# Patient Record
Sex: Female | Born: 1990 | Hispanic: No | Marital: Single | Smoking: Current some day smoker
Health system: Southern US, Community
[De-identification: ages and names within clinical notes are randomized; demographics above are authoritative.]

## PROBLEM LIST (undated history)

## (undated) DIAGNOSIS — S61219A Laceration without foreign body of unspecified finger without damage to nail, initial encounter: Secondary | ICD-10-CM

## (undated) DIAGNOSIS — N83209 Unspecified ovarian cyst, unspecified side: Secondary | ICD-10-CM

---

## 2011-09-27 ENCOUNTER — Ambulatory Visit (INDEPENDENT_AMBULATORY_CARE_PROVIDER_SITE_OTHER): Payer: Medicaid Other | Admitting: *Deleted

## 2011-09-27 VITALS — BP 96/61 | Temp 98.6°F | Ht 63.0 in | Wt 118.0 lb

## 2011-09-27 DIAGNOSIS — Z3480 Encounter for supervision of other normal pregnancy, unspecified trimester: Secondary | ICD-10-CM

## 2011-09-27 NOTE — Progress Notes (Signed)
Pt is here for NOB intake.  Pt is single but boyfriend is happy about the pregnancy.  Bedside U/S showed IUP with CRL 15.16mm and FHT 158BPM and Gest age of 8 weeks.  Prenatal labs drawn today.  Pt does wish to get flu vaccine at next visit.  Pt to return in 1-2 weeks for prenatal exam.

## 2011-09-28 ENCOUNTER — Encounter: Payer: Self-pay | Admitting: Obstetrics & Gynecology

## 2011-09-28 DIAGNOSIS — O26899 Other specified pregnancy related conditions, unspecified trimester: Secondary | ICD-10-CM

## 2011-09-28 DIAGNOSIS — Z6791 Unspecified blood type, Rh negative: Secondary | ICD-10-CM | POA: Insufficient documentation

## 2011-09-28 LAB — OBSTETRIC PANEL
Antibody Screen: NEGATIVE
Basophils Absolute: 0 K/uL (ref 0.0–0.1)
Basophils Relative: 1 % (ref 0–1)
Eosinophils Absolute: 0.1 K/uL (ref 0.0–0.7)
Eosinophils Relative: 1 % (ref 0–5)
HCT: 42.2 % (ref 36.0–46.0)
Hemoglobin: 13.1 g/dL (ref 12.0–15.0)
Hepatitis B Surface Ag: NEGATIVE
Lymphocytes Relative: 22 % (ref 12–46)
Lymphs Abs: 1.4 K/uL (ref 0.7–4.0)
MCH: 26.2 pg (ref 26.0–34.0)
MCHC: 31 g/dL (ref 30.0–36.0)
MCV: 84.4 fL (ref 78.0–100.0)
Monocytes Absolute: 0.5 K/uL (ref 0.1–1.0)
Monocytes Relative: 9 % (ref 3–12)
Neutro Abs: 4.4 K/uL (ref 1.7–7.7)
Neutrophils Relative %: 69 % (ref 43–77)
Platelets: 301 K/uL (ref 150–400)
RBC: 5 MIL/uL (ref 3.87–5.11)
RDW: 15 % (ref 11.5–15.5)
Rh Type: NEGATIVE
Rubella: 34 [IU]/mL — ABNORMAL HIGH
WBC: 6.4 K/uL (ref 4.0–10.5)

## 2011-09-28 LAB — GC/CHLAMYDIA PROBE AMP, URINE
Chlamydia, Swab/Urine, PCR: NEGATIVE
GC Probe Amp, Urine: NEGATIVE

## 2011-09-28 LAB — SICKLE CELL SCREEN: Sickle Cell Screen: NEGATIVE

## 2011-09-28 LAB — HIV ANTIBODY (ROUTINE TESTING W REFLEX): HIV: NONREACTIVE

## 2011-10-05 ENCOUNTER — Ambulatory Visit (INDEPENDENT_AMBULATORY_CARE_PROVIDER_SITE_OTHER): Payer: Medicaid Other | Admitting: Advanced Practice Midwife

## 2011-10-05 ENCOUNTER — Other Ambulatory Visit (HOSPITAL_COMMUNITY)
Admission: RE | Admit: 2011-10-05 | Discharge: 2011-10-05 | Disposition: A | Discharge status: Home / Self Care | Payer: Medicaid Other | Source: Ambulatory Visit | Attending: Advanced Practice Midwife | Admitting: Advanced Practice Midwife

## 2011-10-05 ENCOUNTER — Encounter: Payer: Self-pay | Admitting: Advanced Practice Midwife

## 2011-10-05 DIAGNOSIS — Z3480 Encounter for supervision of other normal pregnancy, unspecified trimester: Secondary | ICD-10-CM

## 2011-10-05 DIAGNOSIS — Z113 Encounter for screening for infections with a predominantly sexual mode of transmission: Secondary | ICD-10-CM | POA: Insufficient documentation

## 2011-10-05 DIAGNOSIS — R87810 Cervical high risk human papillomavirus (HPV) DNA test positive: Secondary | ICD-10-CM | POA: Insufficient documentation

## 2011-10-05 DIAGNOSIS — Z23 Encounter for immunization: Secondary | ICD-10-CM

## 2011-10-05 DIAGNOSIS — O34219 Maternal care for unspecified type scar from previous cesarean delivery: Secondary | ICD-10-CM

## 2011-10-05 DIAGNOSIS — Z01419 Encounter for gynecological examination (general) (routine) without abnormal findings: Secondary | ICD-10-CM | POA: Insufficient documentation

## 2011-10-05 MED ORDER — INFLUENZA VIRUS VACC SPLIT PF IM SUSP: 0.5000 mL | Freq: Once | INTRAMUSCULAR | Status: DC

## 2011-10-05 NOTE — Assessment & Plan Note (Signed)
-   Desires repeat CS

## 2011-10-05 NOTE — Patient Instructions (Addendum)
Pregnancy - First Trimester  During sexual intercourse, millions of sperm go into the vagina. Only 1 sperm will penetrate and fertilize the female egg while it is in the Fallopian tube. One week later, the fertilized egg implants into the wall of the uterus. An embryo begins to develop into a baby. At 6 to 8 weeks, the eyes and face are formed and the heartbeat can be seen on ultrasound. At the end of 12 weeks (first trimester), all the baby's organs are formed. Now that you are pregnant, you will want to do everything you can to have a healthy baby. Two of the most important things are to get good prenatal care and follow your caregiver's instructions. Prenatal care is all the medical care you receive before the baby's birth. It is given to prevent, find, and treat problems during the pregnancy and childbirth.  PRENATAL EXAMS   During prenatal visits, your weight, blood pressure and urine are checked. This is done to make sure you are healthy and progressing normally during the pregnancy.   A pregnant woman should gain 25 to 35 pounds during the pregnancy. However, if you are over weight or underweight, your caregiver will advise you regarding your weight.   Your caregiver will ask and answer questions for you.   Blood work, cervical cultures, other necessary tests and a Pap test are done during your prenatal exams. These tests are done to check on your health and the probable health of your baby. Tests are strongly recommended and done for HIV with your permission. This is the virus that causes AIDS. These tests are done because medications can be given to help prevent your baby from being born with this infection should you have been infected without knowing it. Blood work is also used to find out your blood type, previous infections and follow your blood levels (hemoglobin).   Low hemoglobin (anemia) is common during pregnancy. Iron and vitamins are given to help prevent this. Later in the pregnancy, blood  tests for diabetes will be done along with any other tests if any problems develop. You may need tests to make sure you and the baby are doing well.   You may need other tests to make sure you and the baby are doing well.  CHANGES DURING THE FIRST TRIMESTER (THE FIRST 3 MONTHS OF PREGNANCY)  Your body goes through many changes during pregnancy. They vary from person to person. Talk to your caregiver about changes you notice and are concerned about. Changes can include:   Your menstrual period stops.   The egg and sperm carry the genes that determine what you look like. Genes from you and your partner are forming a baby. The female genes determine whether the baby is a boy or a girl.   Your body increases in girth and you may feel bloated.   Feeling sick to your stomach (nauseous) and throwing up (vomiting). If the vomiting is uncontrollable, call your caregiver.   Your breasts will begin to enlarge and become tender.   Your nipples may stick out more and become darker.   The need to urinate more. Painful urination may mean you have a bladder infection.   Tiring easily.   Loss of appetite.   Cravings for certain kinds of food.   At first, you may gain or lose a couple of pounds.   You may have changes in your emotions from day to day (excited to be pregnant or concerned something may go wrong with   the pregnancy and baby).   You may have more vivid and strange dreams.  HOME CARE INSTRUCTIONS    It is very important to avoid all smoking, alcohol and un-prescribed drugs during your pregnancy. These affect the formation and growth of the baby. Avoid chemicals while pregnant to ensure the delivery of a healthy infant.   Start your prenatal visits by the 12th week of pregnancy. They are usually scheduled monthly at first, then more often in the last 2 months before delivery. Keep your caregiver's appointments. Follow your caregiver's instructions regarding medication use, blood and lab tests, exercise, and  diet.   During pregnancy, you are providing food for you and your baby. Eat regular, well-balanced meals. Choose foods such as meat, fish, milk and other low fat dairy products, vegetables, fruits, and whole-grain breads and cereals. Your caregiver will tell you of the ideal weight gain.   You can help morning sickness by keeping soda crackers at the bedside. Eat a couple before arising in the morning. You may want to use the crackers without salt on them.   Eating 4 to 5 small meals rather than 3 large meals a day also may help the nausea and vomiting.   Drinking liquids between meals instead of during meals also seems to help nausea and vomiting.   A physical sexual relationship may be continued throughout pregnancy if there are no other problems. Problems may be early (premature) leaking of amniotic fluid from the membranes, vaginal bleeding, or belly (abdominal) pain.   Exercise regularly if there are no restrictions. Check with your caregiver or physical therapist if you are unsure of the safety of some of your exercises. Greater weight gain will occur in the last 2 trimesters of pregnancy. Exercising will help:   Control your weight.   Keep you in shape.   Prepare you for labor and delivery.   Help you lose your pregnancy weight after you deliver your baby.   Wear a good support or jogging bra for breast tenderness during pregnancy. This may help if worn during sleep too.   Ask when prenatal classes are available. Begin classes when they are offered.   Do not use hot tubs, steam rooms or saunas.   Wear your seat belt when driving. This protects you and your baby if you are in an accident.   Avoid raw meat, uncooked cheese, cat litter boxes and soil used by cats throughout the pregnancy. These carry germs that can cause birth defects in the baby.   The first trimester is a good time to visit your dentist for your dental health. Getting your teeth cleaned is OK. Use a softer toothbrush and brush  gently during pregnancy.   Ask for help if you have financial, counseling or nutritional needs during pregnancy. Your caregiver will be able to offer counseling for these needs as well as refer you for other special needs.   Do not take any medications or herbs unless told by your caregiver.   Inform your caregiver if there is any mental or physical domestic violence.   Make a list of emergency phone numbers of family, friends, hospital, and police and fire departments.   Write down your questions. Take them to your prenatal visit.   Do not douche.   Do not cross your legs.   If you have to stand for long periods of time, rotate you feet or take small steps in a circle.   You may have more vaginal secretions that may   tampons or scented sanitary pads.  MEDICATIONS AND DRUG USE IN PREGNANCY  Take prenatal vitamins as directed. The vitamin should contain 1 milligram of folic acid. Keep all vitamins out of reach of children. Only a couple vitamins or tablets containing iron may be fatal to a baby or young child when ingested.   Avoid use of all medications, including herbs, over-the-counter medications, not prescribed or suggested by your caregiver. Only take over-the-counter or prescription medicines for pain, discomfort, or fever as directed by your caregiver. Do not use aspirin, ibuprofen, or naproxen unless directed by your caregiver.   Let your caregiver also know about herbs you may be using.   Alcohol is related to a number of birth defects. This includes fetal alcohol syndrome. All alcohol, in any form, should be avoided completely. Smoking will cause low birth rate and premature babies.   Street or illegal drugs are very harmful to the baby. They are absolutely forbidden. A baby born to an addicted mother will be addicted at birth. The baby will go through the same withdrawal an adult does.   Let your  caregiver know about any medications that you have to take and for what reason you take them.  MISCARRIAGE IS COMMON DURING PREGNANCY A miscarriage does not mean you did something wrong. It is not a reason to worry about getting pregnant again. Your caregiver will help you with questions you may have. If you have a miscarriage, you may need minor surgery. SEEK MEDICAL CARE IF:  You have any concerns or worries during your pregnancy. It is better to call with your questions if you feel they cannot wait, rather than worry about them. SEEK IMMEDIATE MEDICAL CARE IF:   An unexplained oral temperature above 102 F (38.9 C) develops, or as your caregiver suggests.   You have leaking of fluid from the vagina (birth canal). If leaking membranes are suspected, take your temperature and inform your caregiver of this when you call.   There is vaginal spotting or bleeding. Notify your caregiver of the amount and how many pads are used.   You develop a bad smelling vaginal discharge with a change in the color.   You continue to feel sick to your stomach (nauseated) and have no relief from remedies suggested. You vomit blood or coffee ground-like materials.   You lose more than 2 pounds of weight in 1 week.   You gain more than 2 pounds of weight in 1 week and you notice swelling of your face, hands, feet, or legs.   You gain 5 pounds or more in 1 week (even if you do not have swelling of your hands, face, legs, or feet).   You get exposed to [place] measles and have never had them.   You are exposed to fifth disease or chickenpox.   You develop belly (abdominal) pain. Round ligament discomfort is a common non-cancerous (benign) cause of abdominal pain in pregnancy. Your caregiver still must evaluate this.   You develop headache, fever, diarrhea, pain with urination, or shortness of breath.   You fall or are in a car accident or have any kind of trauma.   There is mental or physical violence in  your home.  Document Released: 11/13/2001 Document Revised: 08/01/2011 Document Reviewed: 05/17/2009 [facility] Patient Information 2012 [place], [place]. Pregnancy - First Trimester During sexual intercourse, millions of sperm go into the vagina. Only 1 sperm will penetrate and fertilize the female egg while it is in the Fallopian tube. One  week later, the fertilized egg implants into the wall of the uterus. An embryo begins to develop into a baby. At 6 to 8 weeks, the eyes and face are formed and the heartbeat can be seen on ultrasound. At the end of 12 weeks (first trimester), all the baby's organs are formed. Now that you are pregnant, you will want to do everything you can to have a healthy baby. Two of the most important things are to get good prenatal care and follow your caregiver's instructions. Prenatal care is all the medical care you receive before the baby's birth. It is given to prevent, find, and treat problems during the pregnancy and childbirth. PRENATAL EXAMS  During prenatal visits, your weight, blood pressure and urine are checked. This is done to make sure you are healthy and progressing normally during the pregnancy.   A pregnant woman should gain 25 to 35 pounds during the pregnancy. However, if you are over weight or underweight, your caregiver will advise you regarding your weight.   Your caregiver will ask and answer questions for you.   Blood work, cervical cultures, other necessary tests and a Pap test are done during your prenatal exams. These tests are done to check on your health and the probable health of your baby. Tests are strongly recommended and done for HIV with your permission. This is the virus that causes AIDS. These tests are done because medications can be given to help prevent your baby from being born with this infection should you have been infected without knowing it. Blood work is also used to find out your blood type, previous infections and follow your  blood levels (hemoglobin).   Low hemoglobin (anemia) is common during pregnancy. Iron and vitamins are given to help prevent this. Later in the pregnancy, blood tests for diabetes will be done along with any other tests if any problems develop. You may need tests to make sure you and the baby are doing well.   You may need other tests to make sure you and the baby are doing well.  CHANGES DURING THE FIRST TRIMESTER (THE FIRST 3 MONTHS OF PREGNANCY) Your body goes through many changes during pregnancy. They vary from person to person. Talk to your caregiver about changes you notice and are concerned about. Changes can include:  Your menstrual period stops.   The egg and sperm carry the genes that determine what you look like. Genes from you and your partner are forming a baby. The female genes determine whether the baby is a boy or a girl.   Your body increases in girth and you may feel bloated.   Feeling sick to your stomach (nauseous) and throwing up (vomiting). If the vomiting is uncontrollable, call your caregiver.   Your breasts will begin to enlarge and become tender.   Your nipples may stick out more and become darker.   The need to urinate more. Painful urination may mean you have a bladder infection.   Tiring easily.   Loss of appetite.   Cravings for certain kinds of food.   At first, you may gain or lose a couple of pounds.   You may have changes in your emotions from day to day (excited to be pregnant or concerned something may go wrong with the pregnancy and baby).   You may have more vivid and strange dreams.  HOME CARE INSTRUCTIONS   It is very important to avoid all smoking, alcohol and un-prescribed drugs during your pregnancy. These affect  the formation and growth of the baby. Avoid chemicals while pregnant to ensure the delivery of a healthy infant.   Start your prenatal visits by the 12th week of pregnancy. They are usually scheduled monthly at first, then more  often in the last 2 months before delivery. Keep your caregiver's appointments. Follow your caregiver's instructions regarding medication use, blood and lab tests, exercise, and diet.   During pregnancy, you are providing food for you and your baby. Eat regular, well-balanced meals. Choose foods such as meat, fish, milk and other low fat dairy products, vegetables, fruits, and whole-grain breads and cereals. Your caregiver will tell you of the ideal weight gain.   You can help morning sickness by keeping soda crackers at the bedside. Eat a couple before arising in the morning. You may want to use the crackers without salt on them.   Eating 4 to 5 small meals rather than 3 large meals a day also may help the nausea and vomiting.   Drinking liquids between meals instead of during meals also seems to help nausea and vomiting.   A physical sexual relationship may be continued throughout pregnancy if there are no other problems. Problems may be early (premature) leaking of amniotic fluid from the membranes, vaginal bleeding, or belly (abdominal) pain.   Exercise regularly if there are no restrictions. Check with your caregiver or physical therapist if you are unsure of the safety of some of your exercises. Greater weight gain will occur in the last 2 trimesters of pregnancy. Exercising will help:   Control your weight.   Keep you in shape.   Prepare you for labor and delivery.   Help you lose your pregnancy weight after you deliver your baby.   Wear a good support or jogging bra for breast tenderness during pregnancy. This may help if worn during sleep too.   Ask when prenatal classes are available. Begin classes when they are offered.   Do not use hot tubs, steam rooms or saunas.   Wear your seat belt when driving. This protects you and your baby if you are in an accident.   Avoid raw meat, uncooked cheese, cat litter boxes and soil used by cats throughout the pregnancy. These carry germs  that can cause birth defects in the baby.   The first trimester is a good time to visit your dentist for your dental health. Getting your teeth cleaned is OK. Use a softer toothbrush and brush gently during pregnancy.   Ask for help if you have financial, counseling or nutritional needs during pregnancy. Your caregiver will be able to offer counseling for these needs as well as refer you for other special needs.   Do not take any medications or herbs unless told by your caregiver.   Inform your caregiver if there is any mental or physical domestic violence.   Make a list of emergency phone numbers of family, friends, hospital, and police and fire departments.   Write down your questions. Take them to your prenatal visit.   Do not douche.   Do not cross your legs.   If you have to stand for long periods of time, rotate you feet or take small steps in a circle.   You may have more vaginal secretions that may require a sanitary pad. Do not use tampons or scented sanitary pads.  MEDICATIONS AND DRUG USE IN PREGNANCY  Take prenatal vitamins as directed. The vitamin should contain 1 milligram of folic acid. Keep all vitamins out  of reach of children. Only a couple vitamins or tablets containing iron may be fatal to a baby or young child when ingested.   Avoid use of all medications, including herbs, over-the-counter medications, not prescribed or suggested by your caregiver. Only take over-the-counter or prescription medicines for pain, discomfort, or fever as directed by your caregiver. Do not use aspirin, ibuprofen, or naproxen unless directed by your caregiver.   Let your caregiver also know about herbs you may be using.   Alcohol is related to a number of birth defects. This includes fetal alcohol syndrome. All alcohol, in any form, should be avoided completely. Smoking will cause low birth rate and premature babies.   Street or illegal drugs are very harmful to the baby. They are  absolutely forbidden. A baby born to an addicted mother will be addicted at birth. The baby will go through the same withdrawal an adult does.   Let your caregiver know about any medications that you have to take and for what reason you take them.  MISCARRIAGE IS COMMON DURING PREGNANCY A miscarriage does not mean you did something wrong. It is not a reason to worry about getting pregnant again. Your caregiver will help you with questions you may have. If you have a miscarriage, you may need minor surgery. SEEK MEDICAL CARE IF:  You have any concerns or worries during your pregnancy. It is better to call with your questions if you feel they cannot wait, rather than worry about them. SEEK IMMEDIATE MEDICAL CARE IF:   An unexplained oral temperature above 102 F (38.9 C) develops, or as your caregiver suggests.   You have leaking of fluid from the vagina (birth canal). If leaking membranes are suspected, take your temperature and inform your caregiver of this when you call.   There is vaginal spotting or bleeding. Notify your caregiver of the amount and how many pads are used.   You develop a bad smelling vaginal discharge with a change in the color.   You continue to feel sick to your stomach (nauseated) and have no relief from remedies suggested. You vomit blood or coffee ground-like materials.   You lose more than 2 pounds of weight in 1 week.   You gain more than 2 pounds of weight in 1 week and you notice swelling of your face, hands, feet, or legs.   You gain 5 pounds or more in 1 week (even if you do not have swelling of your hands, face, legs, or feet).   You get exposed to [place] measles and have never had them.   You are exposed to fifth disease or chickenpox.   You develop belly (abdominal) pain. Round ligament discomfort is a common non-cancerous (benign) cause of abdominal pain in pregnancy. Your caregiver still must evaluate this.   You develop headache, fever, diarrhea,  pain with urination, or shortness of breath.   You fall or are in a car accident or have any kind of trauma.   There is mental or physical violence in your home.  Document Released: 11/13/2001 Document Revised: 08/01/2011 Document Reviewed: 05/17/2009 [facility] Patient Information 2012 [place], [place].

## 2011-10-05 NOTE — Progress Notes (Signed)
Subjective:    Krystal Henry is a G2P1001 [redacted]w[redacted]d being seen today for her first obstetrical visit.  Her obstetrical history is significant for previous c/s. Patient does intend to breast feed. Pregnancy history fully reviewed. Previous C/S, wants repeat.   Wants Paternity testing Patient reports no complaints.  Filed Vitals:   10/05/11 1008  BP: 106/67  Temp: 98.5 F (36.9 C)  Weight: 116 lb (52.617 kg)    HISTORY: OB History    Grav Para Term Preterm Abortions TAB SAB Ect Mult Living   2 1 1       1      # Outc Date GA Lbr Len/2nd Wgt Sex Del Anes PTL Lv   1 TRM 1/11 [redacted]w[redacted]d  9lb10oz(4.366kg) M CCS EPI No Yes   2 CUR              Past Medical History  Diagnosis Date  . Pregnancy induced hypertension   . Type o blood, rh negative    Past Surgical History  Procedure Date  . Cesarean section    Family History  Problem Relation Age of Onset  . Hypertension Paternal Grandmother   . Hypertension Paternal Grandfather      Exam   Bedside US > FHR 150s Uterine Size: size equals dates  Pelvic Exam:    Perineum: No Hemorrhoids   Vulva: normal   Vagina:  normal mucosa   pH:    Cervix: multiparous appearance   Adnexa: normal adnexa   Bony Pelvis: average  System: Breast:  normal appearance, no masses or tenderness   Skin: normal coloration and turgor, no rashes    Neurologic: oriented   Extremities: normal strength, tone, and muscle mass   HEENT PERRLA   Mouth/Teeth mucous membranes moist, pharynx normal without lesions   Neck supple   Cardiovascular: regular rate and rhythm   Respiratory:  appears well, vitals normal, no respiratory distress, acyanotic, normal RR, ear and throat exam is normal, neck free of mass or lymphadenopathy, chest clear, no wheezing, crepitations, rhonchi, normal symmetric air entry   Abdomen: soft, non-tender; bowel sounds normal; no masses,  no organomegaly   Urinary: urethral meatus normal      Assessment:    Pregnancy:  G2P1001 Patient Active Problem List  Diagnoses  . Rh negative state in antepartum period  . Previous cesarean delivery affecting pregnancy, antepartum        Plan:     Initial labs drawn. Prenatal vitamins. Problem list reviewed and updated. Genetic Screening discussed Integrated Screen: requested.  Ultrasound discussed; fetal survey: requested.  Follow up in 4 weeks.    [facility] 10/05/2011    Subjective:    Krystal Henry is a G2P1001 [redacted]w[redacted]d being seen today for her first obstetrical visit.  Her obstetrical history is significant for n/a. Patient does intend to breast feed. Pregnancy history fully reviewed.  Patient reports no complaints.  Filed Vitals:   10/05/11 1008  BP: 106/67  Temp: 98.5 F (36.9 C)  Weight: 116 lb (52.617 kg)    HISTORY: OB History    Grav Para Term Preterm Abortions TAB SAB Ect Mult Living   2 1 1       1      # Outc Date GA Lbr Len/2nd Wgt Sex Del Anes PTL Lv   1 TRM 1/11 [redacted]w[redacted]d  9lb10oz(4.366kg) M CCS EPI No Yes   2 CUR              Past Medical History  Diagnosis  Date  . Pregnancy induced hypertension   . Type o blood, rh negative    Past Surgical History  Procedure Date  . Cesarean section    Family History  Problem Relation Age of Onset  . Hypertension Paternal Grandmother   . Hypertension Paternal Grandfather      Exam    Uterine Size: size equals dates  Pelvic Exam:    Perineum: No Hemorrhoids   Vulva: normal   Vagina:  normal mucosa   pH:    Cervix: multiparous appearance   Adnexa: normal adnexa   Bony Pelvis: gynecoid  System: Breast:  normal appearance, no masses or tenderness   Skin: normal coloration and turgor, no rashes    Neurologic: oriented   Extremities: normal strength, tone, and muscle mass   HEENT n/a   Mouth/Teeth mucous membranes moist, pharynx normal without lesions and    Neck supple   Cardiovascular: regular rate and rhythm   Respiratory:  appears well, vitals normal, no  respiratory distress, acyanotic, normal RR, ear and throat exam is normal, neck free of mass or lymphadenopathy, chest clear, no wheezing, crepitations, rhonchi, normal symmetric air entry   Abdomen: soft, non-tender; bowel sounds normal; no masses,  no organomegaly   Urinary: urethral meatus normal      Assessment:    Pregnancy: G2P1001 Patient Active Problem List  Diagnoses  . Rh negative state in antepartum period  . Previous cesarean delivery affecting pregnancy, antepartum        Plan:     Initial labs drawn. Prenatal vitamins. Problem list reviewed and updated. Genetic Screening discussed Integrated Screen: requested.  Ultrasound discussed; fetal survey: requested.  Follow up in 4 weeks. 50% of 45 min visit spent on counseling and coordination of care.     [facility] 10/05/2011

## 2011-10-05 NOTE — Progress Notes (Signed)
p=67 

## 2011-10-10 ENCOUNTER — Encounter: Payer: Self-pay | Admitting: Obstetrics & Gynecology

## 2011-10-10 ENCOUNTER — Ambulatory Visit (INDEPENDENT_AMBULATORY_CARE_PROVIDER_SITE_OTHER): Payer: Medicaid Other | Admitting: Obstetrics & Gynecology

## 2011-10-10 VITALS — BP 105/64 | HR 68 | Temp 97.9°F | Resp 16 | Ht 62.0 in | Wt 115.0 lb

## 2011-10-10 DIAGNOSIS — L731 Pseudofolliculitis barbae: Secondary | ICD-10-CM

## 2011-10-10 DIAGNOSIS — L738 Other specified follicular disorders: Secondary | ICD-10-CM

## 2011-10-10 NOTE — Progress Notes (Signed)
  Subjective:    Patient ID: Krystal Henry, female    DOB: [birth year], 20 y.o.   MRN: [id number]  HPI  Pt complaining of bumps in axilla.  Small, painful, no exudate.  Pt shaves daily.  Not here for pregnancy.  Review of Systems  Constitutional: Negative.   Respiratory: Negative.   Cardiovascular: Negative.   Genitourinary: Negative.        Objective:   Physical Exam  Constitutional: She appears well-developed and well-nourished.  HENT:  Head: Normocephalic and atraumatic.  Eyes: Conjunctivae are normal.  Pulmonary/Chest: Breath sounds normal.  Abdominal: Soft.  Skin:       Ingrown hairs in bilateral axilla.  No evidence of abscess            Assessment & Plan:  20 yo female with ingrown haris  1-stop shaving if possible 2-stop deodorant to see if this improves symptoms 3-tend skin from [facility] if continues to shave or use nair. 4-keep regularly scheduled visit.

## 2011-10-10 NOTE — Patient Instructions (Signed)
Pseudofolliculitis Barbae Pseudofolliculitis barbae is a reaction from shaving curly hair too closely. This is very common in African-Americans, but can occur in others as well. It is usually more severe in the neck area, but can occur in all shaved areas such as the groin, scalp and legs. It is usually long lasting unless close shaving is avoided. When curly hair is shaved closely, it grows out from below the skin surface. The hair then becomes ingrown by re-entering the skin surface. This causes a skin reaction and may cause small pockets of infection. DIAGNOSIS  This diagnosis is often made by physical exam. Sometimes cultures may be taken to find if infection is present. TREATMENT   Stop shaving until swelling and soreness (inflammation) is under control.   Using a "buff puff" in a gentle circular motion on the face may help dislodge ingrown hairs.   Antibiotics and local steroids may be used in resistant cases. Your caregiver can advise you.   Hair removal creams may be used weekly. Stop using them if they become too irritating. You can test for irritation by putting a small amount on your arm before applying the cream to your face.   You may start shaving again once the inflammation has gone away.   Laser therapy may be used to destroy hair follicles.  SHAVING INSTRUCTIONS AFTER TREATMENT  Shower before shaving. Keep areas to be shaved packed in warm moist wraps for several minutes before shaving.   Use thick shaving gels before shaving.   Dislodge hair tips that have begun to pierce the skin. Do this with either a clean toothbrush or a sterile needle. The needle may be cleansed in alcohol. This can be done before bathing and at bedtime.   Using a bump fighter razor will help by cutting the hair slightly above the skin level.   Use electric shavers with a longer shave setting.   Shave in the direction of hair growth. Avoid multiple razor strokes.   Use moisturizing lotions  following shaving.  If treatment and shaving instructions do not help, you may need to stop shaving. Document Released: 02/25/2001 Document Revised: 08/01/2011 Document Reviewed: 11/24/2008 [facility] Patient Information 2012 [employer] [place], [place].

## 2011-10-31 ENCOUNTER — Ambulatory Visit (HOSPITAL_COMMUNITY)
Admission: RE | Admit: 2011-10-31 | Discharge: 2011-10-31 | Disposition: A | Discharge status: Home / Self Care | Payer: Medicaid Other | Source: Ambulatory Visit | Attending: Advanced Practice Midwife | Admitting: Advanced Practice Midwife

## 2011-10-31 ENCOUNTER — Other Ambulatory Visit: Payer: Self-pay | Admitting: Advanced Practice Midwife

## 2011-10-31 DIAGNOSIS — Z3689 Encounter for other specified antenatal screening: Secondary | ICD-10-CM | POA: Insufficient documentation

## 2011-10-31 DIAGNOSIS — O09299 Supervision of pregnancy with other poor reproductive or obstetric history, unspecified trimester: Secondary | ICD-10-CM | POA: Insufficient documentation

## 2011-10-31 DIAGNOSIS — O351XX0 Maternal care for (suspected) chromosomal abnormality in fetus, not applicable or unspecified: Secondary | ICD-10-CM | POA: Insufficient documentation

## 2011-10-31 DIAGNOSIS — O3510X0 Maternal care for (suspected) chromosomal abnormality in fetus, unspecified, not applicable or unspecified: Secondary | ICD-10-CM | POA: Insufficient documentation

## 2011-10-31 DIAGNOSIS — O34219 Maternal care for unspecified type scar from previous cesarean delivery: Secondary | ICD-10-CM | POA: Insufficient documentation

## 2011-11-02 ENCOUNTER — Ambulatory Visit (INDEPENDENT_AMBULATORY_CARE_PROVIDER_SITE_OTHER): Payer: Medicaid Other | Admitting: Family

## 2011-11-02 VITALS — BP 104/57 | Temp 98.6°F | Wt 121.0 lb

## 2011-11-02 DIAGNOSIS — O34219 Maternal care for unspecified type scar from previous cesarean delivery: Secondary | ICD-10-CM

## 2011-11-02 DIAGNOSIS — Z3480 Encounter for supervision of other normal pregnancy, unspecified trimester: Secondary | ICD-10-CM

## 2011-11-02 NOTE — Progress Notes (Signed)
Pt here with report of symptoms of hemorrhoids x 1 (bloody stool); no vaginal bleeding; Exam - no hemorrhoids seen; no blood seen on cervix with speculum exam; pt getting integrated screening.   Reviewed HR HPV on pap smear and implications.  Will try OTC hemorrhoid product.

## 2011-11-02 NOTE — Progress Notes (Signed)
p-74  Will get paternity testing done after baby is born.  Having some bleeding with BM's.  Stools are hard.  Encouraged colace daily.

## 2011-11-08 ENCOUNTER — Other Ambulatory Visit: Payer: Self-pay

## 2011-11-29 ENCOUNTER — Encounter: Payer: Medicaid Other | Admitting: Physician Assistant

## 2011-12-07 ENCOUNTER — Ambulatory Visit (INDEPENDENT_AMBULATORY_CARE_PROVIDER_SITE_OTHER): Payer: Medicaid Other | Admitting: Advanced Practice Midwife

## 2011-12-07 VITALS — BP 103/58 | Temp 97.1°F | Wt 128.0 lb

## 2011-12-07 DIAGNOSIS — Z3480 Encounter for supervision of other normal pregnancy, unspecified trimester: Secondary | ICD-10-CM

## 2011-12-07 NOTE — Patient Instructions (Signed)
Pregnancy - Second Trimester The second trimester of pregnancy (3 to 6 months) is a period of rapid growth for you and your baby. At the end of the sixth month, your baby is about 9 inches long and weighs 1 1/2 pounds. You will begin to feel the baby move between 18 and 20 weeks of the pregnancy. This is called quickening. Weight gain is faster. A clear fluid (colostrum) may leak out of your breasts. You may feel small contractions of the womb (uterus). This is known as false labor or Braxton-Hicks contractions. This is like a practice for labor when the baby is ready to be born. Usually, the problems with morning sickness have usually passed by the end of your first trimester. Some women develop small dark blotches (called cholasma, mask of pregnancy) on their face that usually goes away after the baby is born. Exposure to the sun makes the blotches worse. Acne may also develop in some pregnant women and pregnant women who have acne, may find that it goes away. PRENATAL EXAMS  Blood work may continue to be done during prenatal exams. These tests are done to check on your health and the probable health of your baby. Blood work is used to follow your blood levels (hemoglobin). Anemia (low hemoglobin) is common during pregnancy. Iron and vitamins are given to help prevent this. You will also be checked for diabetes between 24 and 28 weeks of the pregnancy. Some of the previous blood tests may be repeated.   The size of the uterus is measured during each visit. This is to make sure that the baby is continuing to grow properly according to the dates of the pregnancy.   Your blood pressure is checked every prenatal visit. This is to make sure you are not getting toxemia.   Your urine is checked to make sure you do not have an infection, diabetes or protein in the urine.   Your weight is checked often to make sure gains are happening at the suggested rate. This is to ensure that both you and your baby are  growing normally.   Sometimes, an ultrasound is performed to confirm the proper growth and development of the baby. This is a test which bounces harmless sound waves off the baby so your caregiver can more accurately determine due dates.  Sometimes, a specialized test is done on the amniotic fluid surrounding the baby. This test is called an amniocentesis. The amniotic fluid is obtained by sticking a needle into the belly (abdomen). This is done to check the chromosomes in instances where there is a concern about possible genetic problems with the baby. It is also sometimes done near the end of pregnancy if an early delivery is required. In this case, it is done to help make sure the baby's lungs are mature enough for the baby to live outside of the womb. CHANGES OCCURING IN THE SECOND TRIMESTER OF PREGNANCY Your body goes through many changes during pregnancy. They vary from person to person. Talk to your caregiver about changes you notice that you are concerned about.  During the second trimester, you will likely have an increase in your appetite. It is normal to have cravings for certain foods. This varies from person to person and pregnancy to pregnancy.   Your lower abdomen will begin to bulge.   You may have to urinate more often because the uterus and baby are pressing on your bladder. It is also common to get more bladder infections during pregnancy (  pain with urination). You can help this by drinking lots of fluids and emptying your bladder before and after intercourse.   You may begin to get stretch marks on your hips, abdomen, and breasts. These are normal changes in the body during pregnancy. There are no exercises or medications to take that prevent this change.   You may begin to develop swollen and bulging veins (varicose veins) in your legs. Wearing support hose, elevating your feet for 15 minutes, 3 to 4 times a day and limiting salt in your diet helps lessen the problem.    Heartburn may develop as the uterus grows and pushes up against the stomach. Antacids recommended by your caregiver helps with this problem. Also, eating smaller meals 4 to 5 times a day helps.   Constipation can be treated with a stool softener or adding bulk to your diet. Drinking lots of fluids, vegetables, fruits, and whole grains are helpful.   Exercising is also helpful. If you have been very active up until your pregnancy, most of these activities can be continued during your pregnancy. If you have been less active, it is helpful to start an exercise program such as walking.   Hemorrhoids (varicose veins in the rectum) may develop at the end of the second trimester. Warm sitz baths and hemorrhoid cream recommended by your caregiver helps hemorrhoid problems.   Backaches may develop during this time of your pregnancy. Avoid heavy lifting, wear low heal shoes and practice good posture to help with backache problems.   Some pregnant women develop tingling and numbness of their hand and fingers because of swelling and tightening of ligaments in the wrist (carpel tunnel syndrome). This goes away after the baby is born.   As your breasts enlarge, you may have to get a bigger bra. Get a comfortable, cotton, support bra. Do not get a nursing bra until the last month of the pregnancy if you will be nursing the baby.   You may get a dark line from your belly button to the pubic area called the linea nigra.   You may develop rosy cheeks because of increase blood flow to the face.   You may develop spider looking lines of the face, neck, arms and chest. These go away after the baby is born.  HOME CARE INSTRUCTIONS   It is extremely important to avoid all smoking, herbs, alcohol, and unprescribed drugs during your pregnancy. These chemicals affect the formation and growth of the baby. Avoid these chemicals throughout the pregnancy to ensure the delivery of a healthy infant.   Most of your home  care instructions are the same as suggested for the first trimester of your pregnancy. Keep your caregiver's appointments. Follow your caregiver's instructions regarding medication use, exercise and diet.   During pregnancy, you are providing food for you and your baby. Continue to eat regular, well-balanced meals. Choose foods such as meat, fish, milk and other low fat dairy products, vegetables, fruits, and whole-grain breads and cereals. Your caregiver will tell you of the ideal weight gain.   A physical sexual relationship may be continued up until near the end of pregnancy if there are no other problems. Problems could include early (premature) leaking of amniotic fluid from the membranes, vaginal bleeding, abdominal pain, or other medical or pregnancy problems.   Exercise regularly if there are no restrictions. Check with your caregiver if you are unsure of the safety of some of your exercises. The greatest weight gain will occur in the   last 2 trimesters of pregnancy. Exercise will help you:   Control your weight.   Get you in shape for labor and delivery.   Lose weight after you have the baby.   Wear a good support or jogging bra for breast tenderness during pregnancy. This may help if worn during sleep. Pads or tissues may be used in the bra if you are leaking colostrum.   Do not use hot tubs, steam rooms or saunas throughout the pregnancy.   Wear your seat belt at all times when driving. This protects you and your baby if you are in an accident.   Avoid raw meat, uncooked cheese, cat litter boxes and soil used by cats. These carry germs that can cause birth defects in the baby.   The second trimester is also a good time to visit your dentist for your dental health if this has not been done yet. Getting your teeth cleaned is OK. Use a soft toothbrush. Brush gently during pregnancy.   It is easier to loose urine during pregnancy. Tightening up and strengthening the pelvic muscles will  help with this problem. Practice stopping your urination while you are going to the bathroom. These are the same muscles you need to strengthen. It is also the muscles you would use as if you were trying to stop from passing gas. You can practice tightening these muscles up 10 times a set and repeating this about 3 times per day. Once you know what muscles to tighten up, do not perform these exercises during urination. It is more likely to contribute to an infection by backing up the urine.   Ask for help if you have financial, counseling or nutritional needs during pregnancy. Your caregiver will be able to offer counseling for these needs as well as refer you for other special needs.   Your skin may become oily. If so, wash your face with mild soap, use non-greasy moisturizer and oil or cream based makeup.  MEDICATIONS AND DRUG USE IN PREGNANCY  Take prenatal vitamins as directed. The vitamin should contain 1 milligram of folic acid. Keep all vitamins out of reach of children. Only a couple vitamins or tablets containing iron may be fatal to a baby or young child when ingested.   Avoid use of all medications, including herbs, over-the-counter medications, not prescribed or suggested by your caregiver. Only take over-the-counter or prescription medicines for pain, discomfort, or fever as directed by your caregiver. Do not use aspirin.   Let your caregiver also know about herbs you may be using.   Alcohol is related to a number of birth defects. This includes fetal alcohol syndrome. All alcohol, in any form, should be avoided completely. Smoking will cause low birth rate and premature babies.   Street or illegal drugs are very harmful to the baby. They are absolutely forbidden. A baby born to an addicted mother will be addicted at birth. The baby will go through the same withdrawal an adult does.  SEEK MEDICAL CARE IF:  You have any concerns or worries during your pregnancy. It is better to call with  your questions if you feel they cannot wait, rather than worry about them. SEEK IMMEDIATE MEDICAL CARE IF:   An unexplained oral temperature above 102 F (38.9 C) develops, or as your caregiver suggests.   You have leaking of fluid from the vagina (birth canal). If leaking membranes are suspected, take your temperature and tell your caregiver of this when you call.   There   is vaginal spotting, bleeding, or passing clots. Tell your caregiver of the amount and how many pads are used. Light spotting in pregnancy is common, especially following intercourse.   You develop a bad smelling vaginal discharge with a change in the color from clear to white.   You continue to feel sick to your stomach (nauseated) and have no relief from remedies suggested. You vomit blood or coffee ground-like materials.   You lose more than 2 pounds of weight or gain more than 2 pounds of weight over 1 week, or as suggested by your caregiver.   You notice swelling of your face, hands, feet, or legs.   You get exposed to [place] measles and have never had them.   You are exposed to fifth disease or chickenpox.   You develop belly (abdominal) pain. Round ligament discomfort is a common non-cancerous (benign) cause of abdominal pain in pregnancy. Your caregiver still must evaluate you.   You develop a bad headache that does not go away.   You develop fever, diarrhea, pain with urination, or shortness of breath.   You develop visual problems, blurry, or double vision.   You fall or are in a car accident or any kind of trauma.   There is mental or physical violence at home.  Document Released: 11/13/2001 Document Revised: 08/01/2011 Document Reviewed: 05/18/2009 [facility] Patient Information 2012 [place], [place].Contraception Choices Birth control (contraception) can stop pregnancy from happening. Different types of birth control work in different ways. Some can:  Make the mucus in the cervix thick. This makes it  hard for sperm to get into the uterus.   Thin the lining of the uterus. This makes it hard for an egg to attach to the wall of the uterus.   Stop the ovaries from releasing an egg.   Block the sperm from reaching the egg.  Certain types of surgery can stop pregnancy from happening. For women, the sugery closes the fallopian tubes (tubal ligation). For men, the surgery stops sperm from releasing during sex (vasectomy). HORMONAL BIRTH CONTROL Hormonal birth control stops pregnancy by putting hormones into your body. Types of birth control include:  A small tube put under the skin of the upper arm (implant). The tube can stay in place for 3 years.   Shots given every 3 months.   Pills taken every day or once after sex (intercourse).   Patches that are changed once a week.   A ring put into the vagina (vaginal ring). The ring is left in place for 3 weeks and removed for 1 week. Then, a new ring is put in the vagina.  BARRIER BIRTH CONTROL  Barrier birth control blocks sperm from reaching the egg. Types of birth control include:   A thin covering worn on the penis (female condom) during sex.   A soft, loose covering put into the vagina (female condom) before sex.   A rubber bowl that sits over the cervix (diaphragm). The bowl must be made for you. The bowl is put into the vagina before sex. The bowl is left in place for 6 to 8 hours after sex.   A small, soft cup that fits over the cervix (cervical cap). The cup must be made for you. The cup can be left in place for 48 hours after sex.   A sponge that is put into the vagina before sex.   A chemical that kills or blocks sperm from getting into the cervix and uterus (spermicide). The chemical may  be a cream, jelly, foam, or pill.  INTRAUTERINE (IUD) BIRTH CONTROL  IUD birth control is a small, T-shaped piece of plastic. The plastic is put inside the uterus. There are 2 types of IUD:  Copper IUD. The IUD is covered in copper wire. The  copper makes a fluid that kills sperm. It can stay in place for 10 years.   Hormone IUD. The hormone stops pregnancy from happening. It can stay in place for 5 years.  NATURAL FAMILY PLANNING BIRTH CONTROL  Natural family planning means not having sex or using barrier birth control when the woman is fertile. A woman can:  Use a calendar to keep track of when she is fertile.   Use a thermometer to measure her body temperature.  Protect yourself against sexual diseases no matter what type of birth control you use. Talk to your doctor about which type of birth control is best for you. Document Released: 09/16/2009 Document Revised: 08/01/2011 Document Reviewed: 03/28/2011 [facility] Patient Information 2012 [place], [place].

## 2011-12-07 NOTE — Progress Notes (Signed)
p=90 

## 2011-12-07 NOTE — Progress Notes (Signed)
Anatomy scan scheduled. Discussed RLTCS vs TOLAC. Plans repeat and no other pregnancies. Discussed IUDs and Nexplanon. AFP done.

## 2011-12-10 ENCOUNTER — Ambulatory Visit (HOSPITAL_COMMUNITY)
Admission: RE | Admit: 2011-12-10 | Discharge: 2011-12-10 | Disposition: A | Discharge status: Home / Self Care | Payer: Medicaid Other | Source: Ambulatory Visit | Attending: Advanced Practice Midwife | Admitting: Advanced Practice Midwife

## 2011-12-10 DIAGNOSIS — Z1389 Encounter for screening for other disorder: Secondary | ICD-10-CM | POA: Insufficient documentation

## 2011-12-10 DIAGNOSIS — Z3480 Encounter for supervision of other normal pregnancy, unspecified trimester: Secondary | ICD-10-CM

## 2011-12-10 DIAGNOSIS — O358XX0 Maternal care for other (suspected) fetal abnormality and damage, not applicable or unspecified: Secondary | ICD-10-CM | POA: Insufficient documentation

## 2011-12-10 DIAGNOSIS — Z363 Encounter for antenatal screening for malformations: Secondary | ICD-10-CM | POA: Insufficient documentation

## 2011-12-31 ENCOUNTER — Encounter: Payer: Self-pay | Admitting: Advanced Practice Midwife

## 2011-12-31 DIAGNOSIS — Z3480 Encounter for supervision of other normal pregnancy, unspecified trimester: Secondary | ICD-10-CM | POA: Insufficient documentation

## 2012-01-04 ENCOUNTER — Encounter: Payer: Medicaid Other | Admitting: *Deleted

## 2012-01-04 NOTE — Progress Notes (Signed)
p- This encounter was created in error - please disregard. This encounter was created in error - please disregard.

## 2012-01-11 ENCOUNTER — Ambulatory Visit (INDEPENDENT_AMBULATORY_CARE_PROVIDER_SITE_OTHER): Payer: Medicaid Other | Admitting: Family

## 2012-01-11 DIAGNOSIS — Z3480 Encounter for supervision of other normal pregnancy, unspecified trimester: Secondary | ICD-10-CM

## 2012-01-11 NOTE — Progress Notes (Signed)
Reports 1-2 brax hicks/day; no questions or concerns;  Next appt in 4 wks with glucola

## 2012-01-11 NOTE — Progress Notes (Signed)
p=90 

## 2012-02-08 ENCOUNTER — Ambulatory Visit (INDEPENDENT_AMBULATORY_CARE_PROVIDER_SITE_OTHER): Payer: Medicaid Other | Admitting: Physician Assistant

## 2012-02-08 VITALS — BP 115/61 | Temp 98.0°F | Wt 146.0 lb

## 2012-02-08 DIAGNOSIS — O360990 Maternal care for other rhesus isoimmunization, unspecified trimester, not applicable or unspecified: Secondary | ICD-10-CM

## 2012-02-08 DIAGNOSIS — O361190 Maternal care for Anti-A sensitization, unspecified trimester, not applicable or unspecified: Secondary | ICD-10-CM

## 2012-02-08 DIAGNOSIS — Z3480 Encounter for supervision of other normal pregnancy, unspecified trimester: Secondary | ICD-10-CM

## 2012-02-08 DIAGNOSIS — Z6791 Unspecified blood type, Rh negative: Secondary | ICD-10-CM

## 2012-02-08 DIAGNOSIS — O26899 Other specified pregnancy related conditions, unspecified trimester: Secondary | ICD-10-CM

## 2012-02-08 DIAGNOSIS — O34219 Maternal care for unspecified type scar from previous cesarean delivery: Secondary | ICD-10-CM

## 2012-02-08 MED ORDER — RHO D IMMUNE GLOBULIN 1500 UNIT/2ML IJ SOLN
300.0000 ug | Freq: Once | INTRAMUSCULAR | Status: AC
  Administered 2012-03-03: 300 ug via INTRAMUSCULAR

## 2012-02-08 NOTE — Progress Notes (Signed)
Addended by: Granville Lewis on: 02/08/2012 11:21 AM   Modules accepted: Orders

## 2012-02-08 NOTE — Progress Notes (Signed)
No complaints. +FM daily. Glucola and Rhogam today.

## 2012-02-08 NOTE — Patient Instructions (Addendum)
Pregnancy - Third Trimester The third trimester of pregnancy (the last 3 months) is a period of the most rapid growth for you and your baby. The baby approaches a length of 20 inches and a weight of 6 to 10 pounds. The baby is adding on fat and getting ready for life outside your body. While inside, babies have periods of sleeping and waking, suck their thumbs, and hiccups. You can often feel small contractions of the uterus. This is false labor. It is also called Braxton-Hicks contractions. This is like a practice for labor. The usual problems in this stage of pregnancy include more difficulty breathing, swelling of the hands and feet from water retention, and having to urinate more often because of the uterus and baby pressing on your bladder.  PRENATAL EXAMS  Blood work may continue to be done during prenatal exams. These tests are done to check on your health and the probable health of your baby. Blood work is used to follow your blood levels (hemoglobin). Anemia (low hemoglobin) is common during pregnancy. Iron and vitamins are given to help prevent this. You may also continue to be checked for diabetes. Some of the past blood tests may be done again.   The size of the uterus is measured during each visit. This makes sure your baby is growing properly according to your pregnancy dates.   Your blood pressure is checked every prenatal visit. This is to make sure you are not getting toxemia.   Your urine is checked every prenatal visit for infection, diabetes and protein.   Your weight is checked at each visit. This is done to make sure gains are happening at the suggested rate and that you and your baby are growing normally.   Sometimes, an ultrasound is performed to confirm the position and the proper growth and development of the baby. This is a test done that bounces harmless sound waves off the baby so your caregiver can more accurately determine due dates.   Discuss the type of pain  medication and anesthesia you will have during your labor and delivery.   Discuss the possibility and anesthesia if a Cesarean Section might be necessary.   Inform your caregiver if there is any mental or physical violence at home.  Sometimes, a specialized non-stress test, contraction stress test and biophysical profile are done to make sure the baby is not having a problem. Checking the amniotic fluid surrounding the baby is called an amniocentesis. The amniotic fluid is removed by sticking a needle into the belly (abdomen). This is sometimes done near the end of pregnancy if an early delivery is required. In this case, it is done to help make sure the baby's lungs are mature enough for the baby to live outside of the womb. If the lungs are not mature and it is unsafe to deliver the baby, an injection of cortisone medication is given to the mother 1 to 2 days before the delivery. This helps the baby's lungs mature and makes it safer to deliver the baby. CHANGES OCCURING IN THE THIRD TRIMESTER OF PREGNANCY Your body goes through many changes during pregnancy. They vary from person to person. Talk to your caregiver about changes you notice and are concerned about.  During the last trimester, you have probably had an increase in your appetite. It is normal to have cravings for certain foods. This varies from person to person and pregnancy to pregnancy.   You may begin to get stretch marks on your hips,   abdomen, and breasts. These are normal changes in the body during pregnancy. There are no exercises or medications to take which prevent this change.   Constipation may be treated with a stool softener or adding bulk to your diet. Drinking lots of fluids, fiber in vegetables, fruits, and whole grains are helpful.   Exercising is also helpful. If you have been very active up until your pregnancy, most of these activities can be continued during your pregnancy. If you have been less active, it is helpful  to start an exercise program such as walking. Consult your caregiver before starting exercise programs.   Avoid all smoking, alcohol, un-prescribed drugs, herbs and "street drugs" during your pregnancy. These chemicals affect the formation and growth of the baby. Avoid chemicals throughout the pregnancy to ensure the delivery of a healthy infant.   Backache, varicose veins and hemorrhoids may develop or get worse.   You will tire more easily in the third trimester, which is normal.   The baby's movements may be stronger and more often.   You may become short of breath easily.   Your belly button may stick out.   A yellow discharge may leak from your breasts called colostrum.   You may have a bloody mucus discharge. This usually occurs a few days to a week before labor begins.  HOME CARE INSTRUCTIONS   Keep your caregiver's appointments. Follow your caregiver's instructions regarding medication use, exercise, and diet.   During pregnancy, you are providing food for you and your baby. Continue to eat regular, well-balanced meals. Choose foods such as meat, fish, milk and other low fat dairy products, vegetables, fruits, and whole-grain breads and cereals. Your caregiver will tell you of the ideal weight gain.   A physical sexual relationship may be continued throughout pregnancy if there are no other problems such as early (premature) leaking of amniotic fluid from the membranes, vaginal bleeding, or belly (abdominal) pain.   Exercise regularly if there are no restrictions. Check with your caregiver if you are unsure of the safety of your exercises. Greater weight gain will occur in the last 2 trimesters of pregnancy. Exercising helps:   Control your weight.   Get you in shape for labor and delivery.   You lose weight after you deliver.   Rest a lot with legs elevated, or as needed for leg cramps or low back pain.   Wear a good support or jogging bra for breast tenderness during  pregnancy. This may help if worn during sleep. Pads or tissues may be used in the bra if you are leaking colostrum.   Do not use hot tubs, steam rooms, or saunas.   Wear your seat belt when driving. This protects you and your baby if you are in an accident.   Avoid raw meat, cat litter boxes and soil used by cats. These carry germs that can cause birth defects in the baby.   It is easier to loose urine during pregnancy. Tightening up and strengthening the pelvic muscles will help with this problem. You can practice stopping your urination while you are going to the bathroom. These are the same muscles you need to strengthen. It is also the muscles you would use if you were trying to stop from passing gas. You can practice tightening these muscles up 10 times a set and repeating this about 3 times per day. Once you know what muscles to tighten up, do not perform these exercises during urination. It is more likely   to cause an infection by backing up the urine.   Ask for help if you have financial, counseling or nutritional needs during pregnancy. Your caregiver will be able to offer counseling for these needs as well as refer you for other special needs.   Make a list of emergency phone numbers and have them available.   Plan on getting help from family or friends when you go home from the hospital.   Make a trial run to the hospital.   Take prenatal classes with the father to understand, practice and ask questions about the labor and delivery.   Prepare the baby's room/nursery.   Do not travel out of the city unless it is absolutely necessary and with the advice of your caregiver.   Wear only low or no heal shoes to have better balance and prevent falling.  MEDICATIONS AND DRUG USE IN PREGNANCY  Take prenatal vitamins as directed. The vitamin should contain 1 milligram of folic acid. Keep all vitamins out of reach of children. Only a couple vitamins or tablets containing iron may be fatal  to a baby or young child when ingested.   Avoid use of all medications, including herbs, over-the-counter medications, not prescribed or suggested by your caregiver. Only take over-the-counter or prescription medicines for pain, discomfort, or fever as directed by your caregiver. Do not use aspirin, ibuprofen (Motrin, Advil, Nuprin) or naproxen (Aleve) unless OK'd by your caregiver.   Let your caregiver also know about herbs you may be using.   Alcohol is related to a number of birth defects. This includes fetal alcohol syndrome. All alcohol, in any form, should be avoided completely. Smoking will cause low birth rate and premature babies.   Street/illegal drugs are very harmful to the baby. They are absolutely forbidden. A baby born to an addicted mother will be addicted at birth. The baby will go through the same withdrawal an adult does.  SEEK MEDICAL CARE IF: You have any concerns or worries during your pregnancy. It is better to call with your questions if you feel they cannot wait, rather than worry about them. DECISIONS ABOUT CIRCUMCISION You may or may not know the sex of your baby. If you know your baby is a boy, it may be time to think about circumcision. Circumcision is the removal of the foreskin of the penis. This is the skin that covers the sensitive end of the penis. There is no proven medical need for this. Often this decision is made on what is popular at the time or based upon religious beliefs and social issues. You can discuss these issues with your caregiver or pediatrician. SEEK IMMEDIATE MEDICAL CARE IF:   An unexplained oral temperature above 102 F (38.9 C) develops, or as your caregiver suggests.   You have leaking of fluid from the vagina (birth canal). If leaking membranes are suspected, take your temperature and tell your caregiver of this when you call.   There is vaginal spotting, bleeding or passing clots. Tell your caregiver of the amount and how many pads are  used.   You develop a bad smelling vaginal discharge with a change in the color from clear to white.   You develop vomiting that lasts more than 24 hours.   You develop chills or fever.   You develop shortness of breath.   You develop burning on urination.   You loose more than 2 pounds of weight or gain more than 2 pounds of weight or as suggested by your   caregiver.   You notice sudden swelling of your face, hands, and feet or legs.   You develop belly (abdominal) pain. Round ligament discomfort is a common non-cancerous (benign) cause of abdominal pain in pregnancy. Your caregiver still must evaluate you.   You develop a severe headache that does not go away.   You develop visual problems, blurred or double vision.   If you have not felt your baby move for more than 1 hour. If you think the baby is not moving as much as usual, eat something with sugar in it and lie down on your left side for an hour. The baby should move at least 4 to 5 times per hour. Call right away if your baby moves less than that.   You fall, are in a car accident or any kind of trauma.   There is mental or physical violence at home.  Document Released: 11/13/2001 Document Revised: 11/08/2011 Document Reviewed: 05/18/2009 [facility] Patient Information 2012 [place], [place].RhoGAM When you are pregnant, you will have a blood test to find out what blood type you are. When a pregnant women is Rh-negative, it means that she does not have the Rh factor or antigen (a specific protein in red blood cells). The father of the baby will also be tested for his blood type. If he is Rh-negative also, the baby will be Rh-negative. In this case, no Rh problems will develop during the pregnancy, and RhoGAM will not be needed. If the father is Rh-positive, it is possible that the baby will be Rh-positive, which is incompatible with the mother's Rh-negative blood. In this case, the mother's blood will react as though she is allergic  to the baby's blood. Her body will produce antibodies to destroy the baby's red blood cells. This causes anemia, brain damage, and even death of the baby. RhoGAM (anti-Rh, anti-D immunoglobulin) is a vaccine or immunization produced from human plasma. It is given to Rh-negative women who have Rh-positive babies, to protect the babies of future pregnancies. Firstborn infants are usually not affected, because it takes time for the mother's body to develop the antibodies. About 15% of people are Rh-negative. CAUSES  In a normal pregnancy, small numbers of the baby's red blood cells get into the mother's bloodstream. When the baby is Rh-positive, the mother's immune system (body system that fights infections and illness) recognizes that the blood is different from her own. The mother's body tries to fight it off and destroy it, like fighting off an infection or illness. The mother's immune system forms antibodies against the baby's blood. These antibodies are passed through the placenta to the baby, and they begin to destroy the baby's red blood cells. The baby becomes anemic (lacking enough red blood cells). The Rh problem does not usually affect the first baby. If the mother does not get RhoGAM, each of the following pregnancies gets more dangerous, if the future babies are Rh-positive. That is because the mother's immune system develops more antibodies each time. It gets stronger and increases the number of antibodies against the baby's red blood cells with each future pregnancy. TREATMENT  RhoGAM is a solution containing small amounts of Rh antibodies. It prevents the development of antibodies against Rh-positive blood. It is injected into the mother at around 7 months of pregnancy, if she is Rh-negative. It is injected again within 72 hours after the baby is born, if the baby is Rh-positive. It is also given anytime during the pregnancy, if uterine bleeding develops. RhoGAM does  not hurt the baby. It has few  and minor side effects, if any, to the mother. A MICRhoGAM (smaller dose) is given in case of a miscarriage, elective abortion, or tubal pregnancy (pregnancy outside the uterus) if it is before 13 weeks of the pregnancy. RhoGAM should be given in the event of:  Miscarriage.   Threatened miscarriage, if there is bleeding.   Induced abortion.   Tubal (ectopic) pregnancy.   Any bleeding during the pregnancy.   Taking an amniotic fluid sample (amniocentesis).   Blood sampling.   Getting the wrong blood transfusion (positive blood in an Rh-negative woman).   Moving the baby from breech to normal position (external version).   Taking a placenta tissue sample (chorionic villus sampling).   Taking a blood sample from the umbilical cord (cordocentesis).   Mass of cysts in the uterus, instead of a fetus (hydatidiform mole).   Failure to get RhoGAM when necessary.   Pregnancy lasting past the due date, when the last RhoGAM shot was given 12 weeks ago.   Injury (trauma) to the abdomen.  RhoGAM should be given even if the Rh-negative mother has a tubal ligation (female sterilization, "tied tubes"). This is because some women will later decide to have surgery to re-open the tubes, in order to get pregnant again. Or the tubal ligation might not work. RhoGAM is given after the delivery of an Rh-positive baby to an Rh-negative mother. It only protects the baby in the next pregnancy. RhoGAM must be given after each delivery of an Rh-positive baby born to an Rh-negative mother. RhoGAM cannot help a pregnant Rh-negative mother if she has already been sensitized with Rh-positive antibodies. RhoGAM is not known to transmit hepatitis or other infectious diseases. Your caregiver can discuss the recommendations and uses of RhoGAM with you. You should not receive RhoGAM if you had an allergic reaction to it in the past. Document Released: 05/11/2002 Document Revised: 11/08/2011 Document Reviewed:  11/29/2009 [facility] Patient Information 2012 [place], [place].

## 2012-02-08 NOTE — Progress Notes (Signed)
Addended by: Granville Lewis on: 02/08/2012 11:10 AM   Modules accepted: Orders

## 2012-02-08 NOTE — Progress Notes (Signed)
p-89  28 wk labs today and needs Rhogam

## 2012-02-09 LAB — CBC
HCT: 40.3 % (ref 36.0–46.0)
Hemoglobin: 12.6 g/dL (ref 12.0–15.0)
MCHC: 31.3 g/dL (ref 30.0–36.0)

## 2012-02-09 LAB — SYPHILIS: RPR W/REFLEX TO RPR TITER AND TREPONEMAL ANTIBODIES, TRADITIONAL SCREENING AND DIAGNOSIS ALGORITHM

## 2012-02-09 LAB — GLUCOSE TOLERANCE, 1 HOUR: Glucose, 1 Hour GTT: 85 mg/dL (ref 70–140)

## 2012-02-11 LAB — ANTIBODY SCREEN: Antibody Screen: NEGATIVE

## 2012-03-03 ENCOUNTER — Encounter: Payer: Self-pay | Admitting: Advanced Practice Midwife

## 2012-03-03 ENCOUNTER — Encounter: Payer: Self-pay | Admitting: *Deleted

## 2012-03-03 ENCOUNTER — Ambulatory Visit (INDEPENDENT_AMBULATORY_CARE_PROVIDER_SITE_OTHER): Payer: Medicaid Other | Admitting: Advanced Practice Midwife

## 2012-03-03 VITALS — BP 120/67 | Temp 97.3°F | Wt 153.0 lb

## 2012-03-03 DIAGNOSIS — O26899 Other specified pregnancy related conditions, unspecified trimester: Secondary | ICD-10-CM

## 2012-03-03 DIAGNOSIS — O360990 Maternal care for other rhesus isoimmunization, unspecified trimester, not applicable or unspecified: Secondary | ICD-10-CM

## 2012-03-03 DIAGNOSIS — Z6791 Unspecified blood type, Rh negative: Secondary | ICD-10-CM

## 2012-03-03 DIAGNOSIS — Z3480 Encounter for supervision of other normal pregnancy, unspecified trimester: Secondary | ICD-10-CM

## 2012-03-03 NOTE — Patient Instructions (Signed)
Pregnancy - Third Trimester The third trimester of pregnancy (the last 3 months) is a period of the most rapid growth for you and your baby. The baby approaches a length of 20 inches and a weight of 6 to 10 pounds. The baby is adding on fat and getting ready for life outside your body. While inside, babies have periods of sleeping and waking, suck their thumbs, and hiccups. You can often feel small contractions of the uterus. This is false labor. It is also called Braxton-Hicks contractions. This is like a practice for labor. The usual problems in this stage of pregnancy include more difficulty breathing, swelling of the hands and feet from water retention, and having to urinate more often because of the uterus and baby pressing on your bladder.  PRENATAL EXAMS  Blood work may continue to be done during prenatal exams. These tests are done to check on your health and the probable health of your baby. Blood work is used to follow your blood levels (hemoglobin). Anemia (low hemoglobin) is common during pregnancy. Iron and vitamins are given to help prevent this. You may also continue to be checked for diabetes. Some of the past blood tests may be done again.   The size of the uterus is measured during each visit. This makes sure your baby is growing properly according to your pregnancy dates.   Your blood pressure is checked every prenatal visit. This is to make sure you are not getting toxemia.   Your urine is checked every prenatal visit for infection, diabetes and protein.   Your weight is checked at each visit. This is done to make sure gains are happening at the suggested rate and that you and your baby are growing normally.   Sometimes, an ultrasound is performed to confirm the position and the proper growth and development of the baby. This is a test done that bounces harmless sound waves off the baby so your caregiver can more accurately determine due dates.   Discuss the type of pain  medication and anesthesia you will have during your labor and delivery.   Discuss the possibility and anesthesia if a Cesarean Section might be necessary.   Inform your caregiver if there is any mental or physical violence at home.  Sometimes, a specialized non-stress test, contraction stress test and biophysical profile are done to make sure the baby is not having a problem. Checking the amniotic fluid surrounding the baby is called an amniocentesis. The amniotic fluid is removed by sticking a needle into the belly (abdomen). This is sometimes done near the end of pregnancy if an early delivery is required. In this case, it is done to help make sure the baby's lungs are mature enough for the baby to live outside of the womb. If the lungs are not mature and it is unsafe to deliver the baby, an injection of cortisone medication is given to the mother 1 to 2 days before the delivery. This helps the baby's lungs mature and makes it safer to deliver the baby. CHANGES OCCURING IN THE THIRD TRIMESTER OF PREGNANCY Your body goes through many changes during pregnancy. They vary from person to person. Talk to your caregiver about changes you notice and are concerned about.  During the last trimester, you have probably had an increase in your appetite. It is normal to have cravings for certain foods. This varies from person to person and pregnancy to pregnancy.   You may begin to get stretch marks on your hips,   abdomen, and breasts. These are normal changes in the body during pregnancy. There are no exercises or medications to take which prevent this change.   Constipation may be treated with a stool softener or adding bulk to your diet. Drinking lots of fluids, fiber in vegetables, fruits, and whole grains are helpful.   Exercising is also helpful. If you have been very active up until your pregnancy, most of these activities can be continued during your pregnancy. If you have been less active, it is helpful  to start an exercise program such as walking. Consult your caregiver before starting exercise programs.   Avoid all smoking, alcohol, un-prescribed drugs, herbs and "street drugs" during your pregnancy. These chemicals affect the formation and growth of the baby. Avoid chemicals throughout the pregnancy to ensure the delivery of a healthy infant.   Backache, varicose veins and hemorrhoids may develop or get worse.   You will tire more easily in the third trimester, which is normal.   The baby's movements may be stronger and more often.   You may become short of breath easily.   Your belly button may stick out.   A yellow discharge may leak from your breasts called colostrum.   You may have a bloody mucus discharge. This usually occurs a few days to a week before labor begins.  HOME CARE INSTRUCTIONS   Keep your caregiver's appointments. Follow your caregiver's instructions regarding medication use, exercise, and diet.   During pregnancy, you are providing food for you and your baby. Continue to eat regular, well-balanced meals. Choose foods such as meat, fish, milk and other low fat dairy products, vegetables, fruits, and whole-grain breads and cereals. Your caregiver will tell you of the ideal weight gain.   A physical sexual relationship may be continued throughout pregnancy if there are no other problems such as early (premature) leaking of amniotic fluid from the membranes, vaginal bleeding, or belly (abdominal) pain.   Exercise regularly if there are no restrictions. Check with your caregiver if you are unsure of the safety of your exercises. Greater weight gain will occur in the last 2 trimesters of pregnancy. Exercising helps:   Control your weight.   Get you in shape for labor and delivery.   You lose weight after you deliver.   Rest a lot with legs elevated, or as needed for leg cramps or low back pain.   Wear a good support or jogging bra for breast tenderness during  pregnancy. This may help if worn during sleep. Pads or tissues may be used in the bra if you are leaking colostrum.   Do not use hot tubs, steam rooms, or saunas.   Wear your seat belt when driving. This protects you and your baby if you are in an accident.   Avoid raw meat, cat litter boxes and soil used by cats. These carry germs that can cause birth defects in the baby.   It is easier to loose urine during pregnancy. Tightening up and strengthening the pelvic muscles will help with this problem. You can practice stopping your urination while you are going to the bathroom. These are the same muscles you need to strengthen. It is also the muscles you would use if you were trying to stop from passing gas. You can practice tightening these muscles up 10 times a set and repeating this about 3 times per day. Once you know what muscles to tighten up, do not perform these exercises during urination. It is more likely   to cause an infection by backing up the urine.   Ask for help if you have financial, counseling or nutritional needs during pregnancy. Your caregiver will be able to offer counseling for these needs as well as refer you for other special needs.   Make a list of emergency phone numbers and have them available.   Plan on getting help from family or friends when you go home from the hospital.   Make a trial run to the hospital.   Take prenatal classes with the father to understand, practice and ask questions about the labor and delivery.   Prepare the baby's room/nursery.   Do not travel out of the city unless it is absolutely necessary and with the advice of your caregiver.   Wear only low or no heal shoes to have better balance and prevent falling.  MEDICATIONS AND DRUG USE IN PREGNANCY  Take prenatal vitamins as directed. The vitamin should contain 1 milligram of folic acid. Keep all vitamins out of reach of children. Only a couple vitamins or tablets containing iron may be fatal  to a baby or young child when ingested.   Avoid use of all medications, including herbs, over-the-counter medications, not prescribed or suggested by your caregiver. Only take over-the-counter or prescription medicines for pain, discomfort, or fever as directed by your caregiver. Do not use aspirin, ibuprofen (Motrin, Advil, Nuprin) or naproxen (Aleve) unless OK'd by your caregiver.   Let your caregiver also know about herbs you may be using.   Alcohol is related to a number of birth defects. This includes fetal alcohol syndrome. All alcohol, in any form, should be avoided completely. Smoking will cause low birth rate and premature babies.   Street/illegal drugs are very harmful to the baby. They are absolutely forbidden. A baby born to an addicted mother will be addicted at birth. The baby will go through the same withdrawal an adult does.  SEEK MEDICAL CARE IF: You have any concerns or worries during your pregnancy. It is better to call with your questions if you feel they cannot wait, rather than worry about them. DECISIONS ABOUT CIRCUMCISION You may or may not know the sex of your baby. If you know your baby is a boy, it may be time to think about circumcision. Circumcision is the removal of the foreskin of the penis. This is the skin that covers the sensitive end of the penis. There is no proven medical need for this. Often this decision is made on what is popular at the time or based upon religious beliefs and social issues. You can discuss these issues with your caregiver or pediatrician. SEEK IMMEDIATE MEDICAL CARE IF:   An unexplained oral temperature above 102 F (38.9 C) develops, or as your caregiver suggests.   You have leaking of fluid from the vagina (birth canal). If leaking membranes are suspected, take your temperature and tell your caregiver of this when you call.   There is vaginal spotting, bleeding or passing clots. Tell your caregiver of the amount and how many pads are  used.   You develop a bad smelling vaginal discharge with a change in the color from clear to white.   You develop vomiting that lasts more than 24 hours.   You develop chills or fever.   You develop shortness of breath.   You develop burning on urination.   You loose more than 2 pounds of weight or gain more than 2 pounds of weight or as suggested by your   caregiver.   You notice sudden swelling of your face, hands, and feet or legs.   You develop belly (abdominal) pain. Round ligament discomfort is a common non-cancerous (benign) cause of abdominal pain in pregnancy. Your caregiver still must evaluate you.   You develop a severe headache that does not go away.   You develop visual problems, blurred or double vision.   If you have not felt your baby move for more than 1 hour. If you think the baby is not moving as much as usual, eat something with sugar in it and lie down on your left side for an hour. The baby should move at least 4 to 5 times per hour. Call right away if your baby moves less than that.   You fall, are in a car accident or any kind of trauma.   There is mental or physical violence at home.  Document Released: 11/13/2001 Document Revised: 11/08/2011 Document Reviewed: 05/18/2009 [employer] Patient Information 2012 [employer]. 

## 2012-03-03 NOTE — Progress Notes (Signed)
p-98  Pt wants to change her CS date

## 2012-03-03 NOTE — Progress Notes (Signed)
Well, no c/o, rev'd PTL and kick counts. Pt requests repeat c/s be scheduled on the Friday prior to her EDD so that her family can be present, request sent for scheduling. Pt aware that this is dependent on OR availability and that C/S cannot be scheduled prior to 39 weeks.

## 2012-03-04 ENCOUNTER — Other Ambulatory Visit: Payer: Self-pay | Admitting: Obstetrics & Gynecology

## 2012-03-17 ENCOUNTER — Encounter: Payer: Medicaid Other | Admitting: Advanced Practice Midwife

## 2012-03-21 ENCOUNTER — Ambulatory Visit (INDEPENDENT_AMBULATORY_CARE_PROVIDER_SITE_OTHER): Payer: Medicaid Other | Admitting: Family

## 2012-03-21 DIAGNOSIS — Z3480 Encounter for supervision of other normal pregnancy, unspecified trimester: Secondary | ICD-10-CM

## 2012-03-21 NOTE — Progress Notes (Signed)
p-85 

## 2012-03-21 NOTE — Progress Notes (Signed)
No questions or concerns, doing good; 1 hr at next visit

## 2012-04-07 ENCOUNTER — Ambulatory Visit (INDEPENDENT_AMBULATORY_CARE_PROVIDER_SITE_OTHER): Payer: Medicaid Other | Admitting: Physician Assistant

## 2012-04-07 VITALS — BP 134/76 | Temp 97.3°F | Wt 163.0 lb

## 2012-04-07 DIAGNOSIS — Z3480 Encounter for supervision of other normal pregnancy, unspecified trimester: Secondary | ICD-10-CM

## 2012-04-07 NOTE — Progress Notes (Signed)
No complaints. RLTCS scheduled for 5/31. Plans to bottle feed. Requests GBS at next visit. Probable Nexplanon for [facility]

## 2012-04-07 NOTE — Patient Instructions (Signed)

## 2012-04-14 ENCOUNTER — Ambulatory Visit (INDEPENDENT_AMBULATORY_CARE_PROVIDER_SITE_OTHER): Payer: Medicaid Other | Admitting: Obstetrics and Gynecology

## 2012-04-14 VITALS — BP 126/72 | Temp 98.0°F | Wt 165.0 lb

## 2012-04-14 DIAGNOSIS — Z3480 Encounter for supervision of other normal pregnancy, unspecified trimester: Secondary | ICD-10-CM

## 2012-04-14 LAB — OB RESULTS CONSOLE GBS: GBS: POSITIVE

## 2012-04-14 NOTE — Progress Notes (Signed)
Doing well. Hx LGA but EFW 7 1/2.  GBS, GC,CT done.

## 2012-04-14 NOTE — Patient Instructions (Signed)
Pregnancy - Third Trimester The third trimester of pregnancy (the last 3 months) is a period of the most rapid growth for you and your baby. The baby approaches a length of 20 inches and a weight of 6 to 10 pounds. The baby is adding on fat and getting ready for life outside your body. While inside, babies have periods of sleeping and waking, suck their thumbs, and hiccups. You can often feel small contractions of the uterus. This is false labor. It is also called Braxton-Hicks contractions. This is like a practice for labor. The usual problems in this stage of pregnancy include more difficulty breathing, swelling of the hands and feet from water retention, and having to urinate more often because of the uterus and baby pressing on your bladder.  PRENATAL EXAMS  Blood work may continue to be done during prenatal exams. These tests are done to check on your health and the probable health of your baby. Blood work is used to follow your blood levels (hemoglobin). Anemia (low hemoglobin) is common during pregnancy. Iron and vitamins are given to help prevent this. You may also continue to be checked for diabetes. Some of the past blood tests may be done again.   The size of the uterus is measured during each visit. This makes sure your baby is growing properly according to your pregnancy dates.   Your blood pressure is checked every prenatal visit. This is to make sure you are not getting toxemia.   Your urine is checked every prenatal visit for infection, diabetes and protein.   Your weight is checked at each visit. This is done to make sure gains are happening at the suggested rate and that you and your baby are growing normally.   Sometimes, an ultrasound is performed to confirm the position and the proper growth and development of the baby. This is a test done that bounces harmless sound waves off the baby so your caregiver can more accurately determine due dates.   Discuss the type of pain  medication and anesthesia you will have during your labor and delivery.   Discuss the possibility and anesthesia if a Cesarean Section might be necessary.   Inform your caregiver if there is any mental or physical violence at home.  Sometimes, a specialized non-stress test, contraction stress test and biophysical profile are done to make sure the baby is not having a problem. Checking the amniotic fluid surrounding the baby is called an amniocentesis. The amniotic fluid is removed by sticking a needle into the belly (abdomen). This is sometimes done near the end of pregnancy if an early delivery is required. In this case, it is done to help make sure the baby's lungs are mature enough for the baby to live outside of the womb. If the lungs are not mature and it is unsafe to deliver the baby, an injection of cortisone medication is given to the mother 1 to 2 days before the delivery. This helps the baby's lungs mature and makes it safer to deliver the baby. CHANGES OCCURING IN THE THIRD TRIMESTER OF PREGNANCY Your body goes through many changes during pregnancy. They vary from person to person. Talk to your caregiver about changes you notice and are concerned about.  During the last trimester, you have probably had an increase in your appetite. It is normal to have cravings for certain foods. This varies from person to person and pregnancy to pregnancy.   You may begin to get stretch marks on your hips,   abdomen, and breasts. These are normal changes in the body during pregnancy. There are no exercises or medications to take which prevent this change.   Constipation may be treated with a stool softener or adding bulk to your diet. Drinking lots of fluids, fiber in vegetables, fruits, and whole grains are helpful.   Exercising is also helpful. If you have been very active up until your pregnancy, most of these activities can be continued during your pregnancy. If you have been less active, it is helpful  to start an exercise program such as walking. Consult your caregiver before starting exercise programs.   Avoid all smoking, alcohol, un-prescribed drugs, herbs and "street drugs" during your pregnancy. These chemicals affect the formation and growth of the baby. Avoid chemicals throughout the pregnancy to ensure the delivery of a healthy infant.   Backache, varicose veins and hemorrhoids may develop or get worse.   You will tire more easily in the third trimester, which is normal.   The baby's movements may be stronger and more often.   You may become short of breath easily.   Your belly button may stick out.   A yellow discharge may leak from your breasts called colostrum.   You may have a bloody mucus discharge. This usually occurs a few days to a week before labor begins.  HOME CARE INSTRUCTIONS   Keep your caregiver's appointments. Follow your caregiver's instructions regarding medication use, exercise, and diet.   During pregnancy, you are providing food for you and your baby. Continue to eat regular, well-balanced meals. Choose foods such as meat, fish, milk and other low fat dairy products, vegetables, fruits, and whole-grain breads and cereals. Your caregiver will tell you of the ideal weight gain.   A physical sexual relationship may be continued throughout pregnancy if there are no other problems such as early (premature) leaking of amniotic fluid from the membranes, vaginal bleeding, or belly (abdominal) pain.   Exercise regularly if there are no restrictions. Check with your caregiver if you are unsure of the safety of your exercises. Greater weight gain will occur in the last 2 trimesters of pregnancy. Exercising helps:   Control your weight.   Get you in shape for labor and delivery.   You lose weight after you deliver.   Rest a lot with legs elevated, or as needed for leg cramps or low back pain.   Wear a good support or jogging bra for breast tenderness during  pregnancy. This may help if worn during sleep. Pads or tissues may be used in the bra if you are leaking colostrum.   Do not use hot tubs, steam rooms, or saunas.   Wear your seat belt when driving. This protects you and your baby if you are in an accident.   Avoid raw meat, cat litter boxes and soil used by cats. These carry germs that can cause birth defects in the baby.   It is easier to loose urine during pregnancy. Tightening up and strengthening the pelvic muscles will help with this problem. You can practice stopping your urination while you are going to the bathroom. These are the same muscles you need to strengthen. It is also the muscles you would use if you were trying to stop from passing gas. You can practice tightening these muscles up 10 times a set and repeating this about 3 times per day. Once you know what muscles to tighten up, do not perform these exercises during urination. It is more likely   to cause an infection by backing up the urine.   Ask for help if you have financial, counseling or nutritional needs during pregnancy. Your caregiver will be able to offer counseling for these needs as well as refer you for other special needs.   Make a list of emergency phone numbers and have them available.   Plan on getting help from family or friends when you go home from the hospital.   Make a trial run to the hospital.   Take prenatal classes with the father to understand, practice and ask questions about the labor and delivery.   Prepare the baby's room/nursery.   Do not travel out of the city unless it is absolutely necessary and with the advice of your caregiver.   Wear only low or no heal shoes to have better balance and prevent falling.  MEDICATIONS AND DRUG USE IN PREGNANCY  Take prenatal vitamins as directed. The vitamin should contain 1 milligram of folic acid. Keep all vitamins out of reach of children. Only a couple vitamins or tablets containing iron may be fatal  to a baby or young child when ingested.   Avoid use of all medications, including herbs, over-the-counter medications, not prescribed or suggested by your caregiver. Only take over-the-counter or prescription medicines for pain, discomfort, or fever as directed by your caregiver. Do not use aspirin, ibuprofen (Motrin, Advil, Nuprin) or naproxen (Aleve) unless OK'd by your caregiver.   Let your caregiver also know about herbs you may be using.   Alcohol is related to a number of birth defects. This includes fetal alcohol syndrome. All alcohol, in any form, should be avoided completely. Smoking will cause low birth rate and premature babies.   Street/illegal drugs are very harmful to the baby. They are absolutely forbidden. A baby born to an addicted mother will be addicted at birth. The baby will go through the same withdrawal an adult does.  SEEK MEDICAL CARE IF: You have any concerns or worries during your pregnancy. It is better to call with your questions if you feel they cannot wait, rather than worry about them. DECISIONS ABOUT CIRCUMCISION You may or may not know the sex of your baby. If you know your baby is a boy, it may be time to think about circumcision. Circumcision is the removal of the foreskin of the penis. This is the skin that covers the sensitive end of the penis. There is no proven medical need for this. Often this decision is made on what is popular at the time or based upon religious beliefs and social issues. You can discuss these issues with your caregiver or pediatrician. SEEK IMMEDIATE MEDICAL CARE IF:   An unexplained oral temperature above 102 F (38.9 C) develops, or as your caregiver suggests.   You have leaking of fluid from the vagina (birth canal). If leaking membranes are suspected, take your temperature and tell your caregiver of this when you call.   There is vaginal spotting, bleeding or passing clots. Tell your caregiver of the amount and how many pads are  used.   You develop a bad smelling vaginal discharge with a change in the color from clear to white.   You develop vomiting that lasts more than 24 hours.   You develop chills or fever.   You develop shortness of breath.   You develop burning on urination.   You loose more than 2 pounds of weight or gain more than 2 pounds of weight or as suggested by your   caregiver.   You notice sudden swelling of your face, hands, and feet or legs.   You develop belly (abdominal) pain. Round ligament discomfort is a common non-cancerous (benign) cause of abdominal pain in pregnancy. Your caregiver still must evaluate you.   You develop a severe headache that does not go away.   You develop visual problems, blurred or double vision.   If you have not felt your baby move for more than 1 hour. If you think the baby is not moving as much as usual, eat something with sugar in it and lie down on your left side for an hour. The baby should move at least 4 to 5 times per hour. Call right away if your baby moves less than that.   You fall, are in a car accident or any kind of trauma.   There is mental or physical violence at home.  Document Released: 11/13/2001 Document Revised: 11/08/2011 Document Reviewed: 05/18/2009 [employer] Patient Information 2012 [employer]. 

## 2012-04-14 NOTE — Progress Notes (Signed)
p-92 

## 2012-04-15 LAB — GC/CHLAMYDIA PROBE AMP, URINE
Chlamydia, Swab/Urine, PCR: POSITIVE — AB
GC Probe Amp, Urine: NEGATIVE

## 2012-04-16 ENCOUNTER — Encounter (HOSPITAL_COMMUNITY): Payer: Self-pay | Admitting: Pharmacist

## 2012-04-18 ENCOUNTER — Encounter: Payer: Self-pay | Admitting: Obstetrics and Gynecology

## 2012-04-18 DIAGNOSIS — O99820 Streptococcus B carrier state complicating pregnancy: Secondary | ICD-10-CM | POA: Insufficient documentation

## 2012-04-21 ENCOUNTER — Ambulatory Visit (INDEPENDENT_AMBULATORY_CARE_PROVIDER_SITE_OTHER): Payer: Medicaid Other | Admitting: Advanced Practice Midwife

## 2012-04-21 VITALS — BP 112/67 | Temp 97.6°F | Wt 167.0 lb

## 2012-04-21 DIAGNOSIS — Z3480 Encounter for supervision of other normal pregnancy, unspecified trimester: Secondary | ICD-10-CM

## 2012-04-21 DIAGNOSIS — Z3490 Encounter for supervision of normal pregnancy, unspecified, unspecified trimester: Secondary | ICD-10-CM

## 2012-04-21 MED ORDER — AZITHROMYCIN 1 G PO PACK: 1.0000 | PACK | Freq: Once | ORAL | Status: DC

## 2012-04-21 NOTE — Progress Notes (Signed)
p-88  Pt is positive for Chlamydia and needs RX.

## 2012-04-21 NOTE — Progress Notes (Signed)
Doing well. Has C/S scheduled. Will call in Rx for Zithromax 1gm for Chlamydia. Advised partner treatment. Pt agrees.

## 2012-04-21 NOTE — Patient Instructions (Signed)
Pregnancy - Third Trimester The third trimester of pregnancy (the last 3 months) is a period of the most rapid growth for you and your baby. The baby approaches a length of 20 inches and a weight of 6 to 10 pounds. The baby is adding on fat and getting ready for life outside your body. While inside, babies have periods of sleeping and waking, suck their thumbs, and hiccups. You can often feel small contractions of the uterus. This is false labor. It is also called Braxton-Hicks contractions. This is like a practice for labor. The usual problems in this stage of pregnancy include more difficulty breathing, swelling of the hands and feet from water retention, and having to urinate more often because of the uterus and baby pressing on your bladder.  PRENATAL EXAMS  Blood work may continue to be done during prenatal exams. These tests are done to check on your health and the probable health of your baby. Blood work is used to follow your blood levels (hemoglobin). Anemia (low hemoglobin) is common during pregnancy. Iron and vitamins are given to help prevent this. You may also continue to be checked for diabetes. Some of the past blood tests may be done again.   The size of the uterus is measured during each visit. This makes sure your baby is growing properly according to your pregnancy dates.   Your blood pressure is checked every prenatal visit. This is to make sure you are not getting toxemia.   Your urine is checked every prenatal visit for infection, diabetes and protein.   Your weight is checked at each visit. This is done to make sure gains are happening at the suggested rate and that you and your baby are growing normally.   Sometimes, an ultrasound is performed to confirm the position and the proper growth and development of the baby. This is a test done that bounces harmless sound waves off the baby so your caregiver can more accurately determine due dates.   Discuss the type of pain  medication and anesthesia you will have during your labor and delivery.   Discuss the possibility and anesthesia if a Cesarean Section might be necessary.   Inform your caregiver if there is any mental or physical violence at home.  Sometimes, a specialized non-stress test, contraction stress test and biophysical profile are done to make sure the baby is not having a problem. Checking the amniotic fluid surrounding the baby is called an amniocentesis. The amniotic fluid is removed by sticking a needle into the belly (abdomen). This is sometimes done near the end of pregnancy if an early delivery is required. In this case, it is done to help make sure the baby's lungs are mature enough for the baby to live outside of the womb. If the lungs are not mature and it is unsafe to deliver the baby, an injection of cortisone medication is given to the mother 1 to 2 days before the delivery. This helps the baby's lungs mature and makes it safer to deliver the baby. CHANGES OCCURING IN THE THIRD TRIMESTER OF PREGNANCY Your body goes through many changes during pregnancy. They vary from person to person. Talk to your caregiver about changes you notice and are concerned about.  During the last trimester, you have probably had an increase in your appetite. It is normal to have cravings for certain foods. This varies from person to person and pregnancy to pregnancy.   You may begin to get stretch marks on your hips,   abdomen, and breasts. These are normal changes in the body during pregnancy. There are no exercises or medications to take which prevent this change.   Constipation may be treated with a stool softener or adding bulk to your diet. Drinking lots of fluids, fiber in vegetables, fruits, and whole grains are helpful.   Exercising is also helpful. If you have been very active up until your pregnancy, most of these activities can be continued during your pregnancy. If you have been less active, it is helpful  to start an exercise program such as walking. Consult your caregiver before starting exercise programs.   Avoid all smoking, alcohol, un-prescribed drugs, herbs and "street drugs" during your pregnancy. These chemicals affect the formation and growth of the baby. Avoid chemicals throughout the pregnancy to ensure the delivery of a healthy infant.   Backache, varicose veins and hemorrhoids may develop or get worse.   You will tire more easily in the third trimester, which is normal.   The baby's movements may be stronger and more often.   You may become short of breath easily.   Your belly button may stick out.   A yellow discharge may leak from your breasts called colostrum.   You may have a bloody mucus discharge. This usually occurs a few days to a week before labor begins.  HOME CARE INSTRUCTIONS   Keep your caregiver's appointments. Follow your caregiver's instructions regarding medication use, exercise, and diet.   During pregnancy, you are providing food for you and your baby. Continue to eat regular, well-balanced meals. Choose foods such as meat, fish, milk and other low fat dairy products, vegetables, fruits, and whole-grain breads and cereals. Your caregiver will tell you of the ideal weight gain.   A physical sexual relationship may be continued throughout pregnancy if there are no other problems such as early (premature) leaking of amniotic fluid from the membranes, vaginal bleeding, or belly (abdominal) pain.   Exercise regularly if there are no restrictions. Check with your caregiver if you are unsure of the safety of your exercises. Greater weight gain will occur in the last 2 trimesters of pregnancy. Exercising helps:   Control your weight.   Get you in shape for labor and delivery.   You lose weight after you deliver.   Rest a lot with legs elevated, or as needed for leg cramps or low back pain.   Wear a good support or jogging bra for breast tenderness during  pregnancy. This may help if worn during sleep. Pads or tissues may be used in the bra if you are leaking colostrum.   Do not use hot tubs, steam rooms, or saunas.   Wear your seat belt when driving. This protects you and your baby if you are in an accident.   Avoid raw meat, cat litter boxes and soil used by cats. These carry germs that can cause birth defects in the baby.   It is easier to loose urine during pregnancy. Tightening up and strengthening the pelvic muscles will help with this problem. You can practice stopping your urination while you are going to the bathroom. These are the same muscles you need to strengthen. It is also the muscles you would use if you were trying to stop from passing gas. You can practice tightening these muscles up 10 times a set and repeating this about 3 times per day. Once you know what muscles to tighten up, do not perform these exercises during urination. It is more likely   to cause an infection by backing up the urine.   Ask for help if you have financial, counseling or nutritional needs during pregnancy. Your caregiver will be able to offer counseling for these needs as well as refer you for other special needs.   Make a list of emergency phone numbers and have them available.   Plan on getting help from family or friends when you go home from the hospital.   Make a trial run to the hospital.   Take prenatal classes with the father to understand, practice and ask questions about the labor and delivery.   Prepare the baby's room/nursery.   Do not travel out of the city unless it is absolutely necessary and with the advice of your caregiver.   Wear only low or no heal shoes to have better balance and prevent falling.  MEDICATIONS AND DRUG USE IN PREGNANCY  Take prenatal vitamins as directed. The vitamin should contain 1 milligram of folic acid. Keep all vitamins out of reach of children. Only a couple vitamins or tablets containing iron may be fatal  to a baby or young child when ingested.   Avoid use of all medications, including herbs, over-the-counter medications, not prescribed or suggested by your caregiver. Only take over-the-counter or prescription medicines for pain, discomfort, or fever as directed by your caregiver. Do not use aspirin, ibuprofen (Motrin, Advil, Nuprin) or naproxen (Aleve) unless OK'd by your caregiver.   Let your caregiver also know about herbs you may be using.   Alcohol is related to a number of birth defects. This includes fetal alcohol syndrome. All alcohol, in any form, should be avoided completely. Smoking will cause low birth rate and premature babies.   Street/illegal drugs are very harmful to the baby. They are absolutely forbidden. A baby born to an addicted mother will be addicted at birth. The baby will go through the same withdrawal an adult does.  SEEK MEDICAL CARE IF: You have any concerns or worries during your pregnancy. It is better to call with your questions if you feel they cannot wait, rather than worry about them. DECISIONS ABOUT CIRCUMCISION You may or may not know the sex of your baby. If you know your baby is a boy, it may be time to think about circumcision. Circumcision is the removal of the foreskin of the penis. This is the skin that covers the sensitive end of the penis. There is no proven medical need for this. Often this decision is made on what is popular at the time or based upon religious beliefs and social issues. You can discuss these issues with your caregiver or pediatrician. SEEK IMMEDIATE MEDICAL CARE IF:   An unexplained oral temperature above 102 F (38.9 C) develops, or as your caregiver suggests.   You have leaking of fluid from the vagina (birth canal). If leaking membranes are suspected, take your temperature and tell your caregiver of this when you call.   There is vaginal spotting, bleeding or passing clots. Tell your caregiver of the amount and how many pads are  used.   You develop a bad smelling vaginal discharge with a change in the color from clear to white.   You develop vomiting that lasts more than 24 hours.   You develop chills or fever.   You develop shortness of breath.   You develop burning on urination.   You loose more than 2 pounds of weight or gain more than 2 pounds of weight or as suggested by your   caregiver.   You notice sudden swelling of your face, hands, and feet or legs.   You develop belly (abdominal) pain. Round ligament discomfort is a common non-cancerous (benign) cause of abdominal pain in pregnancy. Your caregiver still must evaluate you.   You develop a severe headache that does not go away.   You develop visual problems, blurred or double vision.   If you have not felt your baby move for more than 1 hour. If you think the baby is not moving as much as usual, eat something with sugar in it and lie down on your left side for an hour. The baby should move at least 4 to 5 times per hour. Call right away if your baby moves less than that.   You fall, are in a car accident or any kind of trauma.   There is mental or physical violence at home.  Document Released: 11/13/2001 Document Revised: 11/08/2011 Document Reviewed: 05/18/2009 [employer] Patient Information 2012 [employer]. 

## 2012-04-23 ENCOUNTER — Encounter (HOSPITAL_COMMUNITY): Payer: Self-pay

## 2012-04-23 ENCOUNTER — Encounter (HOSPITAL_COMMUNITY)
Admission: RE | Admit: 2012-04-23 | Discharge: 2012-04-23 | Disposition: A | Discharge status: Home / Self Care | Payer: Medicaid Other | Source: Ambulatory Visit | Attending: Family Medicine | Admitting: Family Medicine

## 2012-04-23 VITALS — BP 117/82 | HR 94 | Ht 63.0 in | Wt 165.0 lb

## 2012-04-23 DIAGNOSIS — Z3480 Encounter for supervision of other normal pregnancy, unspecified trimester: Secondary | ICD-10-CM

## 2012-04-23 LAB — CBC
MCH: 25.9 pg — ABNORMAL LOW (ref 26.0–34.0)
MCHC: 31.9 g/dL (ref 30.0–36.0)
MCV: 81.2 fL (ref 78.0–100.0)
Platelets: 163 10*3/uL (ref 150–400)
RDW: 15.8 % — ABNORMAL HIGH (ref 11.5–15.5)
WBC: 8.5 10*3/uL (ref 4.0–10.5)

## 2012-04-23 LAB — SURGICAL PCR SCREEN: Staphylococcus aureus: NEGATIVE

## 2012-04-23 NOTE — Patient Instructions (Addendum)
   Your procedure is scheduled on: Friday May 31st  Enter through the Main Entrance of [facility] at: 7:30am Pick up the phone at the desk and dial [phone] and inform us of your arrival.  Please call this number if you have any problems the morning of surgery: [phone]  Remember: Do not eat food after midnight:Thursday Do not drink clear liquids after: midnight Thursday Take these medicines the morning of surgery with a SIP OF WATER: none  Do not wear jewelry, make-up, or FINGER nail polish Do not wear lotions, powders, perfumes or deodorant. Do not shave 48 hours prior to surgery. Do not bring valuables to the hospital. Contacts, dentures or bridgework may not be worn into surgery.  Leave suitcase in the car. After Surgery it may be brought to your room. For patients being admitted to the hospital, checkout time is 11:00am the day of discharge.      Remember to use your hibiclens as instructed.Please shower with 1/2 bottle the evening before your surgery and the other 1/2 bottle the morning of surgery. Neck down avoiding private area.

## 2012-04-29 ENCOUNTER — Encounter: Payer: Medicaid Other | Admitting: Obstetrics & Gynecology

## 2012-05-01 ENCOUNTER — Encounter (HOSPITAL_COMMUNITY)
Admission: AD | Disposition: A | Discharge status: Home / Self Care | Payer: Self-pay | Source: Ambulatory Visit | Attending: Obstetrics & Gynecology

## 2012-05-01 ENCOUNTER — Inpatient Hospital Stay (HOSPITAL_COMMUNITY)
Admission: AD | Admit: 2012-05-01 | Discharge: 2012-05-04 | DRG: 766 | Disposition: A | Discharge status: Home / Self Care | Payer: Medicaid Other | Source: Ambulatory Visit | Attending: Obstetrics & Gynecology | Admitting: Obstetrics & Gynecology

## 2012-05-01 ENCOUNTER — Encounter (HOSPITAL_COMMUNITY): Payer: Self-pay | Admitting: Anesthesiology

## 2012-05-01 ENCOUNTER — Inpatient Hospital Stay (HOSPITAL_COMMUNITY): Payer: Medicaid Other | Admitting: Anesthesiology

## 2012-05-01 ENCOUNTER — Encounter (HOSPITAL_COMMUNITY): Payer: Self-pay | Admitting: *Deleted

## 2012-05-01 DIAGNOSIS — O34219 Maternal care for unspecified type scar from previous cesarean delivery: Principal | ICD-10-CM | POA: Diagnosis present

## 2012-05-01 DIAGNOSIS — Z01818 Encounter for other preprocedural examination: Secondary | ICD-10-CM

## 2012-05-01 DIAGNOSIS — Z01812 Encounter for preprocedural laboratory examination: Secondary | ICD-10-CM

## 2012-05-01 DIAGNOSIS — Z3480 Encounter for supervision of other normal pregnancy, unspecified trimester: Secondary | ICD-10-CM

## 2012-05-01 SURGERY — Surgical Case
Anesthesia: Spinal | Wound class: Clean Contaminated

## 2012-05-01 MED ORDER — LACTATED RINGERS IV SOLN
INTRAVENOUS | Status: DC
  Administered 2012-05-02: 03:00:00 via INTRAVENOUS

## 2012-05-01 MED ORDER — KETOROLAC TROMETHAMINE 60 MG/2ML IM SOLN
60.0000 mg | Freq: Once | INTRAMUSCULAR | Status: AC | PRN
  Administered 2012-05-01: 60 mg via INTRAMUSCULAR
  Filled 2012-05-01: qty 2

## 2012-05-01 MED ORDER — PHENYLEPHRINE HCL 10 MG/ML IJ SOLN
INTRAMUSCULAR | Status: DC | PRN
  Administered 2012-05-01: 80 ug via INTRAVENOUS

## 2012-05-01 MED ORDER — DIPHENHYDRAMINE HCL 50 MG/ML IJ SOLN
INTRAMUSCULAR | Status: AC
  Filled 2012-05-01: qty 1

## 2012-05-01 MED ORDER — KETOROLAC TROMETHAMINE 30 MG/ML IJ SOLN: 30.0000 mg | Freq: Four times a day (QID) | INTRAMUSCULAR | Status: AC | PRN

## 2012-05-01 MED ORDER — CITRIC ACID-SODIUM CITRATE 334-500 MG/5ML PO SOLN
ORAL | Status: AC
  Administered 2012-05-01: 30 mL
  Filled 2012-05-01: qty 15

## 2012-05-01 MED ORDER — CEFAZOLIN SODIUM-DEXTROSE 2-3 GM-% IV SOLR: 2.0000 g | INTRAVENOUS | Status: DC

## 2012-05-01 MED ORDER — SODIUM CHLORIDE 0.9 % IV SOLN: INTRAVENOUS | Status: DC

## 2012-05-01 MED ORDER — DIPHENHYDRAMINE HCL 50 MG/ML IJ SOLN
INTRAMUSCULAR | Status: DC | PRN
  Administered 2012-05-01: 25 mg via INTRAVENOUS

## 2012-05-01 MED ORDER — BUPIVACAINE IN DEXTROSE 0.75-8.25 % IT SOLN
INTRATHECAL | Status: DC | PRN
  Administered 2012-05-01: 1.5 mL via INTRATHECAL

## 2012-05-01 MED ORDER — SODIUM CHLORIDE 0.9 % IJ SOLN: 3.0000 mL | INTRAMUSCULAR | Status: DC | PRN

## 2012-05-01 MED ORDER — DIBUCAINE 1 % RE OINT: 1.0000 "application " | TOPICAL_OINTMENT | RECTAL | Status: DC | PRN

## 2012-05-01 MED ORDER — TETANUS-DIPHTH-ACELL PERTUSSIS 5-2.5-18.5 LF-MCG/0.5 IM SUSP
0.5000 mL | Freq: Once | INTRAMUSCULAR | Status: AC
  Administered 2012-05-02: 0.5 mL via INTRAMUSCULAR
  Filled 2012-05-01: qty 0.5

## 2012-05-01 MED ORDER — NALOXONE HCL 0.4 MG/ML IJ SOLN: 0.4000 mg | INTRAMUSCULAR | Status: DC | PRN

## 2012-05-01 MED ORDER — CEFAZOLIN SODIUM-DEXTROSE 2-3 GM-% IV SOLR
2.0000 g | Freq: Once | INTRAVENOUS | Status: DC
  Filled 2012-05-01: qty 50

## 2012-05-01 MED ORDER — OXYTOCIN 20 UNITS IN LACTATED RINGERS INFUSION - SIMPLE
125.0000 mL/h | INTRAVENOUS | Status: AC
  Administered 2012-05-01: 125 mL/h via INTRAVENOUS
  Filled 2012-05-01: qty 1000

## 2012-05-01 MED ORDER — KETOROLAC TROMETHAMINE 60 MG/2ML IM SOLN
INTRAMUSCULAR | Status: AC
  Filled 2012-05-01: qty 2

## 2012-05-01 MED ORDER — SODIUM CHLORIDE 0.9 % IV SOLN: 1.0000 ug/kg/h | INTRAVENOUS | Status: DC | PRN

## 2012-05-01 MED ORDER — CEFAZOLIN SODIUM 1-5 GM-% IV SOLN
INTRAVENOUS | Status: DC | PRN
  Administered 2012-05-01: 2 g via INTRAVENOUS

## 2012-05-01 MED ORDER — OXYTOCIN 10 UNIT/ML IJ SOLN
INTRAMUSCULAR | Status: AC
  Filled 2012-05-01: qty 4

## 2012-05-01 MED ORDER — IBUPROFEN 600 MG PO TABS
600.0000 mg | ORAL_TABLET | Freq: Four times a day (QID) | ORAL | Status: DC
  Administered 2012-05-01 – 2012-05-04 (×10): 600 mg via ORAL
  Filled 2012-05-01 (×7): qty 1

## 2012-05-01 MED ORDER — LACTATED RINGERS IV SOLN
INTRAVENOUS | Status: DC
  Administered 2012-05-01: 500 mL via INTRAVENOUS

## 2012-05-01 MED ORDER — LACTATED RINGERS IV SOLN
INTRAVENOUS | Status: DC | PRN
  Administered 2012-05-01 (×3): via INTRAVENOUS

## 2012-05-01 MED ORDER — 0.9 % SODIUM CHLORIDE (POUR BTL) OPTIME
TOPICAL | Status: DC | PRN
  Administered 2012-05-01: 1000 mL

## 2012-05-01 MED ORDER — FENTANYL CITRATE 0.05 MG/ML IJ SOLN
INTRAMUSCULAR | Status: AC
  Filled 2012-05-01: qty 2

## 2012-05-01 MED ORDER — OXYCODONE-ACETAMINOPHEN 5-325 MG PO TABS
1.0000 | ORAL_TABLET | ORAL | Status: DC | PRN
  Administered 2012-05-02: 2 via ORAL
  Administered 2012-05-02: 1 via ORAL
  Administered 2012-05-02 – 2012-05-04 (×7): 2 via ORAL
  Administered 2012-05-04: 1 via ORAL
  Filled 2012-05-01 (×3): qty 2
  Filled 2012-05-01: qty 1
  Filled 2012-05-01: qty 2
  Filled 2012-05-01: qty 1
  Filled 2012-05-01 (×4): qty 2

## 2012-05-01 MED ORDER — FENTANYL CITRATE 0.05 MG/ML IJ SOLN: 25.0000 ug | INTRAMUSCULAR | Status: DC | PRN

## 2012-05-01 MED ORDER — ONDANSETRON HCL 4 MG/2ML IJ SOLN: 4.0000 mg | Freq: Three times a day (TID) | INTRAMUSCULAR | Status: DC | PRN

## 2012-05-01 MED ORDER — MEPERIDINE HCL 25 MG/ML IJ SOLN: 6.2500 mg | INTRAMUSCULAR | Status: DC | PRN

## 2012-05-01 MED ORDER — ONDANSETRON HCL 4 MG/2ML IJ SOLN
INTRAMUSCULAR | Status: AC
  Filled 2012-05-01: qty 2

## 2012-05-01 MED ORDER — LANOLIN HYDROUS EX OINT: 1.0000 "application " | TOPICAL_OINTMENT | CUTANEOUS | Status: DC | PRN

## 2012-05-01 MED ORDER — MORPHINE SULFATE (PF) 0.5 MG/ML IJ SOLN
INTRAMUSCULAR | Status: DC | PRN
  Administered 2012-05-01: .15 mg via INTRATHECAL

## 2012-05-01 MED ORDER — NALBUPHINE SYRINGE 5 MG/0.5 ML
5.0000 mg | INJECTION | INTRAMUSCULAR | Status: DC | PRN
  Filled 2012-05-01: qty 1

## 2012-05-01 MED ORDER — ONDANSETRON HCL 4 MG PO TABS
4.0000 mg | ORAL_TABLET | ORAL | Status: DC | PRN
Start: 2012-05-01 — End: 2012-05-04

## 2012-05-01 MED ORDER — WITCH HAZEL-GLYCERIN EX PADS: 1.0000 "application " | MEDICATED_PAD | CUTANEOUS | Status: DC | PRN

## 2012-05-01 MED ORDER — DIPHENHYDRAMINE HCL 25 MG PO CAPS
25.0000 mg | ORAL_CAPSULE | ORAL | Status: DC | PRN
  Filled 2012-05-01: qty 1

## 2012-05-01 MED ORDER — IBUPROFEN 600 MG PO TABS
600.0000 mg | ORAL_TABLET | Freq: Four times a day (QID) | ORAL | Status: DC | PRN
  Filled 2012-05-01 (×3): qty 1

## 2012-05-01 MED ORDER — DIPHENHYDRAMINE HCL 50 MG/ML IJ SOLN: 25.0000 mg | INTRAMUSCULAR | Status: DC | PRN

## 2012-05-01 MED ORDER — EPHEDRINE 5 MG/ML INJ
INTRAVENOUS | Status: AC
  Filled 2012-05-01: qty 10

## 2012-05-01 MED ORDER — ONDANSETRON HCL 4 MG/2ML IJ SOLN: 4.0000 mg | INTRAMUSCULAR | Status: DC | PRN

## 2012-05-01 MED ORDER — DIPHENHYDRAMINE HCL 50 MG/ML IJ SOLN: 12.5000 mg | INTRAMUSCULAR | Status: DC | PRN

## 2012-05-01 MED ORDER — EPHEDRINE SULFATE 50 MG/ML IJ SOLN
INTRAMUSCULAR | Status: DC | PRN
  Administered 2012-05-01 (×2): 10 mg via INTRAVENOUS

## 2012-05-01 MED ORDER — DIPHENHYDRAMINE HCL 25 MG PO CAPS: 25.0000 mg | ORAL_CAPSULE | Freq: Four times a day (QID) | ORAL | Status: DC | PRN

## 2012-05-01 MED ORDER — OXYTOCIN 20 UNITS IN LACTATED RINGERS INFUSION - SIMPLE
INTRAVENOUS | Status: DC | PRN
  Administered 2012-05-01: 20 [IU] via INTRAVENOUS

## 2012-05-01 MED ORDER — PRENATAL MULTIVITAMIN CH
1.0000 | ORAL_TABLET | Freq: Every day | ORAL | Status: DC
  Administered 2012-05-02 – 2012-05-04 (×3): 1 via ORAL
  Filled 2012-05-01 (×3): qty 1

## 2012-05-01 MED ORDER — ONDANSETRON HCL 4 MG/2ML IJ SOLN
INTRAMUSCULAR | Status: DC | PRN
  Administered 2012-05-01: 4 mg via INTRAVENOUS

## 2012-05-01 MED ORDER — SIMETHICONE 80 MG PO CHEW
80.0000 mg | CHEWABLE_TABLET | ORAL | Status: DC | PRN
  Administered 2012-05-01 – 2012-05-03 (×2): 80 mg via ORAL

## 2012-05-01 MED ORDER — SCOPOLAMINE 1 MG/3DAYS TD PT72: 1.0000 | MEDICATED_PATCH | Freq: Once | TRANSDERMAL | Status: DC

## 2012-05-01 MED ORDER — MORPHINE SULFATE 0.5 MG/ML IJ SOLN
INTRAMUSCULAR | Status: AC
  Filled 2012-05-01: qty 10

## 2012-05-01 MED ORDER — MENTHOL 3 MG MT LOZG: 1.0000 | LOZENGE | OROMUCOSAL | Status: DC | PRN

## 2012-05-01 MED ORDER — METOCLOPRAMIDE HCL 5 MG/ML IJ SOLN: 10.0000 mg | Freq: Three times a day (TID) | INTRAMUSCULAR | Status: DC | PRN

## 2012-05-01 MED ORDER — FENTANYL CITRATE 0.05 MG/ML IJ SOLN
INTRAMUSCULAR | Status: DC | PRN
  Administered 2012-05-01: 25 ug via INTRATHECAL

## 2012-05-01 MED ORDER — NALOXONE HCL 0.4 MG/ML IJ SOLN
1.0000 ug/kg/h | INTRAMUSCULAR | Status: DC | PRN
  Filled 2012-05-01: qty 2.5

## 2012-05-01 MED ORDER — MEASLES, MUMPS & RUBELLA VAC ~~LOC~~ INJ
0.5000 mL | INJECTION | Freq: Once | SUBCUTANEOUS | Status: DC
  Filled 2012-05-01: qty 0.5

## 2012-05-01 MED ORDER — PHENYLEPHRINE 40 MCG/ML (10ML) SYRINGE FOR IV PUSH (FOR BLOOD PRESSURE SUPPORT)
PREFILLED_SYRINGE | INTRAVENOUS | Status: AC
  Filled 2012-05-01: qty 5

## 2012-05-01 SURGICAL SUPPLY — 31 items
CHLORAPREP W/TINT 26ML (MISCELLANEOUS) ×2 IMPLANT
CLOTH BEACON ORANGE TIMEOUT ST (SAFETY) ×2 IMPLANT
CONTAINER PREFILL 10% NBF 15ML (MISCELLANEOUS) IMPLANT
DRAIN JACKSON PRT FLT 7MM (DRAIN) IMPLANT
DRESSING TELFA 8X3 (GAUZE/BANDAGES/DRESSINGS) IMPLANT
DRSG COVADERM 4X10 (GAUZE/BANDAGES/DRESSINGS) ×2 IMPLANT
ELECT REM PT RETURN 9FT ADLT (ELECTROSURGICAL) ×2
ELECTRODE REM PT RTRN 9FT ADLT (ELECTROSURGICAL) ×1 IMPLANT
EVACUATOR SILICONE 100CC (DRAIN) IMPLANT
EXTRACTOR VACUUM M CUP 4 TUBE (SUCTIONS) IMPLANT
GAUZE SPONGE 4X4 12PLY STRL LF (GAUZE/BANDAGES/DRESSINGS) ×4 IMPLANT
GLOVE BIO SURGEON STRL SZ7 (GLOVE) ×2 IMPLANT
GLOVE BIOGEL PI IND STRL 7.0 (GLOVE) ×2 IMPLANT
GLOVE BIOGEL PI INDICATOR 7.0 (GLOVE) ×2
GOWN PREVENTION PLUS LG XLONG (DISPOSABLE) ×6 IMPLANT
KIT ABG SYR 3ML LUER SLIP (SYRINGE) IMPLANT
NEEDLE HYPO 25X5/8 SAFETYGLIDE (NEEDLE) ×2 IMPLANT
NS IRRIG 1000ML POUR BTL (IV SOLUTION) ×2 IMPLANT
PACK C SECTION WH (CUSTOM PROCEDURE TRAY) ×2 IMPLANT
PAD ABD 7.5X8 STRL (GAUZE/BANDAGES/DRESSINGS) ×2 IMPLANT
RTRCTR C-SECT PINK 25CM LRG (MISCELLANEOUS) ×2 IMPLANT
SLEEVE SCD COMPRESS KNEE MED (MISCELLANEOUS) IMPLANT
SPONGE GAUZE 4X4 12PLY (GAUZE/BANDAGES/DRESSINGS) ×2 IMPLANT
STAPLER VISISTAT 35W (STAPLE) IMPLANT
SUT VIC AB 0 CTX 36 (SUTURE) ×5
SUT VIC AB 0 CTX36XBRD ANBCTRL (SUTURE) ×5 IMPLANT
SUT VIC AB 4-0 KS 27 (SUTURE) ×2 IMPLANT
TAPE CLOTH SURG 4X10 WHT LF (GAUZE/BANDAGES/DRESSINGS) ×2 IMPLANT
TOWEL OR 17X24 6PK STRL BLUE (TOWEL DISPOSABLE) ×4 IMPLANT
TRAY FOLEY CATH 14FR (SET/KITS/TRAYS/PACK) ×2 IMPLANT
WATER STERILE IRR 1000ML POUR (IV SOLUTION) IMPLANT

## 2012-05-01 NOTE — H&P (Signed)
Krystal Henry is a 21 y.o. female presenting for labor.  Pt for repeat c/s tomorrow.  . Maternal Medical History:  Reason for admission: Reason for admission: contractions.  Contractions: Onset was 3-5 hours ago.   Frequency: regular.   Perceived severity is strong.    Fetal activity: Perceived fetal activity is normal.   Last perceived fetal movement was within the past hour.    Prenatal complications: No bleeding.     OB History    Grav Para Term Preterm Abortions TAB SAB Ect Mult Living   2 1 1       1      Past Medical History  Diagnosis Date  . Type o blood, rh negative   . Headache   . Chlamydia current    new antibiotic to start today 04/23/12   Past Surgical History  Procedure Date  . Cesarean section 2011   Family History: family history includes Hypertension in her paternal grandfather and paternal grandmother. Social History:  reports that she quit smoking about 8 months ago. Her smoking use included Cigarettes. She has a 3 pack-year smoking history. She does not have any smokeless tobacco history on file. She reports that she does not drink alcohol or use illicit drugs.  Review of Systems  Constitutional: Negative.   Respiratory: Negative.   Cardiovascular: Negative.   Gastrointestinal: Positive for abdominal pain.  Psychiatric/Behavioral: Negative.       Blood pressure 144/80, pulse 80, temperature 97.9 F (36.6 C), temperature source Oral, last menstrual period 07/31/2011. Maternal Exam:  Uterine Assessment: Contraction strength is moderate.  Abdomen: Surgical scars: low transverse.   Introitus: Normal vulva. Normal vagina.    Physical Exam  Constitutional: She appears well-developed and well-nourished.  HENT:  Head: Normocephalic and atraumatic.  Eyes: Conjunctivae are normal.  Neck: Neck supple.  Cardiovascular: Normal rate and regular rhythm.   Respiratory: Effort normal and breath sounds normal.  GI: Soft.  Musculoskeletal: She exhibits no  edema and no tenderness.  Neurological: She is alert.  Skin: Skin is warm and dry.  Psychiatric: She has a normal mood and affect.    Prenatal labs: ABO, Rh: O/NEG/-- (10/25 1102) Antibody: NEG (03/08 1110) Rubella: 34.0 (10/25 1102) RPR: NON REACTIVE (05/22 1208)  HBsAg: NEGATIVE (10/25 1102)  HIV: NON REACTIVE (03/08 1110)  GBS:     Assessment/Plan: 21 year old female in labor with prior c/s.  Pt wants repeat c/s.  The risks of cesarean section discussed with the patient included but were not limited to: bleeding which may require transfusion or reoperation; infection which may require antibiotics; injury to bowel, bladder, ureters or other surrounding organs; injury to the fetus; need for additional procedures including hysterectomy in the event of a life-threatening hemorrhage; placental abnormalities wth subsequent pregnancies, incisional problems, thromboembolic phenomenon and other postoperative/anesthesia complications. The patient concurred with the proposed plan, giving informed written consent for the procedure.   Pt refuses VBAC.    Krystal Henry H. 05/01/2012, 1:34 PM

## 2012-05-01 NOTE — Anesthesia Preprocedure Evaluation (Signed)
Anesthesia Evaluation  Patient identified by MRN, date of birth, ID band Patient awake    Reviewed: Allergy & Precautions, H&P , Patient's Chart, lab work & pertinent test results  Airway Mallampati: III TM Distance: >3 FB Neck ROM: Full    Dental No notable dental hx. (+) Teeth Intact   Pulmonary neg pulmonary ROS,  breath sounds clear to auscultation  Pulmonary exam normal       Cardiovascular negative cardio ROS  Rhythm:Regular Rate:Normal     Neuro/Psych  Headaches, negative psych ROS   GI/Hepatic negative GI ROS, Neg liver ROS,   Endo/Other  negative endocrine ROS  Renal/GU negative Renal ROS  negative genitourinary   Musculoskeletal   Abdominal Normal abdominal exam  (+)   Peds  Hematology negative hematology ROS (+)   Anesthesia Other Findings   Reproductive/Obstetrics (+) Pregnancy                           Anesthesia Physical Anesthesia Plan  ASA: II and Emergent  Anesthesia Plan: Spinal   Post-op Pain Management:    Induction:   Airway Management Planned:   Additional Equipment:   Intra-op Plan:   Post-operative Plan:   Informed Consent: I have reviewed the patients History and Physical, chart, labs and discussed the procedure including the risks, benefits and alternatives for the proposed anesthesia with the patient or authorized representative who has indicated his/her understanding and acceptance.     Plan Discussed with: Anesthesiologist, Surgeon and CRNA  Anesthesia Plan Comments:         Anesthesia Quick Evaluation

## 2012-05-01 NOTE — MAU Note (Signed)
BS will triage this patient due to census in MAU.

## 2012-05-01 NOTE — Consult Note (Signed)
Neonatology Note:   Attendance at C-section:    I was asked to attend this repeat C/S at term in labor. The mother is a G2P1 O neg, GBS pos with onset of labor. ROM at delivery, fluid meconium-stained. Infant vigorous with good spontaneous cry and tone. Needed only minimal bulb suctioning. Ap 9/9. Lungs clear to ausc in DR. To CN to care of Pediatrician.   Deatra James, MD

## 2012-05-01 NOTE — Op Note (Signed)
[name] PROCEDURE DATE: 05/01/2012  PREOPERATIVE DIAGNOSIS: Intrauterine pregnancy at  [redacted]w[redacted]d weeks gestation in labor for repeat cesarean section  POSTOPERATIVE DIAGNOSIS: The same  PROCEDURE:    Low Transverse Cesarean Section  SURGEON:  Dr. Elsie Lincoln  INDICATIONS: Krystal Henry is a 21 y.o. [id number] at [redacted]w[redacted]d presents in early labor and desires repeat cesarean section.  The risks of cesarean section discussed with the patient included but were not limited to: bleeding which may require transfusion or reoperation; infection which may require antibiotics; injury to bowel, bladder, ureters or other surrounding organs; injury to the fetus; need for additional procedures including hysterectomy in the event of a life-threatening hemorrhage; placental abnormalities wth subsequent pregnancies, incisional problems, thromboembolic phenomenon and other postoperative/anesthesia complications. The patient concurred with the proposed plan, giving informed written consent for the procedure.    FINDINGS:  Viable female infant in cephalic presentation, Apgar 9 and 9 Meconium statined amniotic fluid.  Intact placenta, three vessel cord.  Grossly normal uterus, ovaries and fallopian tubes. .   ANESTHESIA:    Epidural ESTIMATED BLOOD LOSS: 800 ml SPECIMENS: Placenta sent to L & D COMPLICATIONS: None immediate  PROCEDURE IN DETAIL:  The patient received intravenous antibiotics and had sequential compression devices applied to her lower extremities while in the preoperative area.  She was then taken to the operating room where spinal anesthesia was administered and was found to be adequate. She was then placed in a dorsal supine position with a leftward tilt, and prepped and draped in a sterile manner.  A foley catheter was placed into her bladder and attached to constant gravity.  After an adequate timeout was performed, a Pfannenstiel skin incision was made with scalpel and carried through to the  underlying layer of fascia. The fascia was incised in the midline and this incision was extended bilaterally using the Mayo scissors. Kocher clamps were applied to the superior aspect of the fascial incision and the underlying rectus muscles were dissected off bluntly. A similar process was carried out on the inferior aspect of the facial incision. The rectus muscles were separated in the midline bluntly and the peritoneum was entered bluntly.   A transverse hysterotomy was made with a scalpel and extended bilaterally bluntly. The bladder blade was then removed. The infant was successfully delivered, and cord was clamped and cut and infant was handed over to awaiting neonatology team. Uterine massage was then administered and the placenta delivered intact with three-vessel cord. The uterus was cleared of clot and debris.  The hysterotomy was closed with 0 vicryl.  A second imbricating suture of 0-Vicryl was used to reinforce the incision and aid in hemostasis.  The peritoneum and rectus muscles were noted to be hemostatic.  The fascia was closed with 0-Vicryl in a running fashion with good restoration of anatomy.  The subcutaneus tissue was copiously irrigated.  The skin was closed with 4-0 vicryl in a subcuticular fashion.  Dressing was applied.  Pt tolerated the procedure will.  All counts were correct x2.  Pt went to the recovery room in stable condition.

## 2012-05-01 NOTE — Transfer of Care (Signed)
Immediate Anesthesia Transfer of Care Note  Patient: Krystal Henry  Procedure(s) Performed: Procedure(s) (LRB): CESAREAN SECTION (N/A)  Patient Location: PACU  Anesthesia Type: Spinal  Level of Consciousness: awake, alert  and oriented  Airway & Oxygen Therapy: Patient Spontanous Breathing  Post-op Assessment: Report given to PACU RN and Post -op Vital signs reviewed and stable  Post vital signs: Reviewed and stable  Complications: No apparent anesthesia complications

## 2012-05-01 NOTE — Anesthesia Postprocedure Evaluation (Signed)
  Anesthesia Post-op Note  Patient: Krystal Henry, Krystal Henry  Procedure(s) Performed: Procedure(s) (LRB): CESAREAN SECTION (N/A)  Patient Location: PACU  Anesthesia Type: Spinal  Level of Consciousness: awake, alert  and oriented  Airway and Oxygen Therapy: Patient Spontanous Breathing  Post-op Pain: none  Post-op Assessment: Post-op Vital signs reviewed, Patient's Cardiovascular Status Stable, Respiratory Function Stable, Patent Airway, No signs of Nausea or vomiting, Pain level controlled, No headache and No backache  Post-op Vital Signs: Reviewed and stable  Complications: No apparent anesthesia complications

## 2012-05-01 NOTE — Anesthesia Procedure Notes (Signed)
Spinal  Patient location during procedure: OR Start time: 05/01/2012 2:22 PM Staffing Anesthesiologist: Kree Armato A. Performed by: anesthesiologist  Preanesthetic Checklist Completed: patient identified, site marked, surgical consent, pre-op evaluation, timeout performed, IV checked, risks and benefits discussed and monitors and equipment checked Spinal Block Patient position: sitting Prep: site prepped and draped and DuraPrep Patient monitoring: heart rate, cardiac monitor, continuous pulse ox and blood pressure Approach: midline Location: L3-4 Injection technique: single-shot Needle Needle type: Sprotte  Needle gauge: 24 G Needle length: 9 cm Needle insertion depth: 4 cm Assessment Sensory level: T6 Additional Notes Patient tolerated procedure well. Adequate sensory level.

## 2012-05-02 ENCOUNTER — Encounter (HOSPITAL_COMMUNITY): Payer: Self-pay | Admitting: Obstetrics & Gynecology

## 2012-05-02 ENCOUNTER — Encounter (HOSPITAL_COMMUNITY): Admission: RE | Payer: Self-pay | Source: Ambulatory Visit

## 2012-05-02 ENCOUNTER — Inpatient Hospital Stay (HOSPITAL_COMMUNITY)
Admission: RE | Admit: 2012-05-02 | Payer: Medicaid Other | Source: Ambulatory Visit | Admitting: Obstetrics & Gynecology

## 2012-05-02 LAB — CBC
MCH: 25.9 pg — ABNORMAL LOW (ref 26.0–34.0)
MCHC: 32 g/dL (ref 30.0–36.0)
Platelets: 135 10*3/uL — ABNORMAL LOW (ref 150–400)
RBC: 4.24 MIL/uL (ref 3.87–5.11)
RDW: 16.2 % — ABNORMAL HIGH (ref 11.5–15.5)

## 2012-05-02 SURGERY — Surgical Case
Anesthesia: Regional | Site: Abdomen

## 2012-05-02 NOTE — Progress Notes (Signed)
Subjective: Postpartum Day 1: Cesarean Delivery Patient reports tolerating PO and + flatus.  Foley being removed now.  Objective: Vital signs in last 24 hours: Temp:  [97.3 F (36.3 C)-98.3 F (36.8 C)] 98 F (36.7 C) (05/31 0158) Pulse Rate:  [66-102] 100  (05/31 0158) Resp:  [18-20] 18  (05/31 0158) BP: (86-144)/(43-88) 120/61 mmHg (05/31 0158) SpO2:  [96 %-100 %] 96 % (05/31 0158) Weight:  [74.844 kg (165 lb)] 74.844 kg (165 lb) (05/30 1354)  Physical Exam:  General: alert, cooperative and no distress Lochia: appropriate Uterine Fundus: firm Incision: dressing c/d/i DVT Evaluation: No evidence of DVT seen on physical exam. Negative Homan's sign. No cords or calf tenderness.   Basename 05/02/12 0510  HGB 11.0*  HCT 34.4*    Assessment/Plan: Status post Cesarean section. Doing well postoperatively.  Continue current care. Discharge tomorrow. Pt not breastfeeding Pt wants Nexplanon for birth control  Krystal Henry H. 05/02/2012, 6:30 AM

## 2012-05-02 NOTE — Progress Notes (Signed)
UR chart review completed.  

## 2012-05-03 NOTE — Progress Notes (Signed)
Subjective: Postpartum Day 2: Cesarean Delivery Patient reports tolerating PO, + flatus and no problems voiding.    Objective: Vital signs in last 24 hours: Temp:  [97.7 F (36.5 C)-97.9 F (36.6 C)] 97.7 F (36.5 C) (06/01 0503) Pulse Rate:  [91-97] 91  (06/01 0503) Resp:  [17-18] 18  (06/01 0503) BP: (100-121)/(61-63) 121/61 mmHg (06/01 0503)  Physical Exam:  General: alert, cooperative and appears stated age CVS:  RRR, without murmur, gallops, or rubs Lungs:  CTA bilat ABD:  +BSx4, normal  Lochia: appropriate Uterine Fundus: firm Incision: healing well, no significant drainage, no dehiscence, no significant erythema DVT Evaluation: No evidence of DVT seen on physical exam. Negative Homan's sign.   Basename 05/02/12 0510  HGB 11.0*  HCT 34.4*    Assessment/Plan: Status post Cesarean section. Doing well postoperatively.  Continue current care.  [facility] 05/03/2012, 9:32 AM

## 2012-05-04 MED ORDER — IBUPROFEN 600 MG PO TABS: 600.0000 mg | ORAL_TABLET | Freq: Four times a day (QID) | ORAL | Status: AC

## 2012-05-04 MED ORDER — OXYCODONE-ACETAMINOPHEN 5-325 MG PO TABS: 1.0000 | ORAL_TABLET | ORAL | Status: AC | PRN

## 2012-05-04 NOTE — Discharge Instructions (Signed)

## 2012-05-04 NOTE — Discharge Summary (Signed)
Obstetric Discharge Summary Reason for Admission: onset of labor and cesarean section Prenatal Procedures: NST and ultrasound Intrapartum Procedures: cesarean: low cervical, transverse Postpartum Procedures: Rho(D) Ig Complications-Operative and Postpartum: none Hemoglobin  Date Value Range Status  05/02/2012 11.0* 12.0-15.0 (g/dL) Final     HCT  Date Value Range Status  05/02/2012 34.4* 36.0-46.0 (%) Final    Physical Exam:  General: alert, cooperative and appears stated age CVS:  RRR, without murmur, gallops, or rubs Lungs:  CTA bilat ABD:  +BSx4, normal  Lochia: appropriate Uterine Fundus: firm Incision: healing well, no significant drainage, no dehiscence, no significant erythema DVT Evaluation: No evidence of DVT seen on physical exam. Negative Homan's sign.  Discharge Diagnoses: Term Pregnancy-delivered  Discharge Information: Date: 05/04/2012 Activity: pelvic rest Diet: routine Medications: PNV, Ibuprofen and Percocet Condition: stable Instructions: refer to practice specific booklet Discharge to: home Follow-up Information    Follow up with [facility] in 4 weeks.   Contact information:   [address] [place] [address]          Newborn Data: Live born female  Birth Weight: 8 lb 10.1 oz (3915 g) APGAR: 9, 9  Home with mother.  [facility] 05/04/2012, 7:33 AM

## 2012-06-13 ENCOUNTER — Ambulatory Visit (INDEPENDENT_AMBULATORY_CARE_PROVIDER_SITE_OTHER): Payer: Medicaid Other | Admitting: Advanced Practice Midwife

## 2012-06-13 ENCOUNTER — Encounter: Payer: Self-pay | Admitting: Advanced Practice Midwife

## 2012-06-13 VITALS — BP 117/72 | HR 73 | Temp 98.5°F | Resp 16 | Ht 62.0 in | Wt 142.0 lb

## 2012-06-13 DIAGNOSIS — Z09 Encounter for follow-up examination after completed treatment for conditions other than malignant neoplasm: Secondary | ICD-10-CM

## 2012-06-13 DIAGNOSIS — IMO0001 Reserved for inherently not codable concepts without codable children: Secondary | ICD-10-CM

## 2012-06-13 DIAGNOSIS — Z8619 Personal history of other infectious and parasitic diseases: Secondary | ICD-10-CM | POA: Insufficient documentation

## 2012-06-13 NOTE — Patient Instructions (Signed)
Health Maintenance, 18- to 21-Year-Old SCHOOL PERFORMANCE After high school completion, the young adult may be attending college, technical or vocational school, or entering the military or the work force. SOCIAL AND EMOTIONAL DEVELOPMENT The young adult establishes adult relationships and explores sexual identity. Young adults may be living at home or in a college dorm or apartment. Increasing independence is important with young adults. Throughout adolescence, teens should assume responsibility of their own health care. IMMUNIZATIONS Most young adults should be fully vaccinated. A booster dose of Tdap (tetanus, diphtheria, and pertussis, or "whooping cough"), a dose of meningococcal vaccine to protect against a certain type of bacterial meningitis, hepatitis A, human papillomarvirus (HPV), chickenpox, or measles vaccines may be indicated, if not given at an earlier age. Annual influenza or "flu" vaccination should be considered during flu season.  TESTING Annual screening for vision and hearing problems is recommended. Vision should be screened objectively at least once between 18 and 21 years of age. The young adult may be screened for anemia or tuberculosis. Young adults should have a blood test to check for high cholesterol during this time period. Young adults should be screened for use of alcohol and drugs. If the young adult is sexually active, screening for sexually transmitted infections, pregnancy, or HIV may be performed. Screening for cervical cancer should be performed within 3 years of beginning sexual activity. NUTRITION AND ORAL HEALTH  Adequate calcium intake is important. Consume 3 servings of low-fat milk and dairy products daily. For those who do not drink milk or consume dairy products, calcium enriched foods, such as juice, bread, or cereal, dark, leafy greens, or canned fish are alternate sources of calcium.   Drink plenty of water. Limit fruit juice to 8 to 12 ounces per day.  Avoid sugary beverages or sodas.   Discourage skipping meals, especially breakfast. Teens should eat a good variety of vegetables and fruits, as well as lean meats.   Avoid high fat, high salt, and high sugar foods, such as candy, chips, and cookies.   Encourage young adults to participate in meal planning and preparation.   Eat meals together as a family whenever possible. Encourage conversation at mealtime.   Limit fast food choices and eating out at restaurants.   Brush teeth twice a day and floss.   Schedule dental exams twice a year.  SLEEP Regular sleep habits are important. PHYSICAL, SOCIAL, AND EMOTIONAL DEVELOPMENT  One hour of regular physical activity daily is recommended. Continue to participate in sports.   Encourage young adults to develop their own interests and consider community service or volunteerism.   Provide guidance to the young adult in making decisions about college and work plans.   Make sure that young adults know that they should never be in a situation that makes them uncomfortable, and they should tell partners if they do not want to engage in sexual activity.   Talk to the young adult about body image. Eating disorders may be noted at this time. Young adults may also be concerned about being overweight. Monitor the young adult for weight gain or loss.   Mood disturbances, depression, anxiety, alcoholism, or attention problems may be noted in young adults. Talk to the caregiver if there are concerns about mental illness.   Negotiate limit setting and independent decision making.   Encourage the young adult to handle conflict without physical violence.   Avoid loud noises which may impair hearing.   Limit television and computer time to 2 hours per day.   Individuals who engage in excessive sedentary activity are more likely to become overweight.  RISK BEHAVIORS  Sexually active young adults need to take precautions against pregnancy and sexually  transmitted infections. Talk to young adults about contraception.   Provide a tobacco-free and drug-free environment for the young adult. Talk to the young adult about drug, tobacco, and alcohol use among friends or at friends' homes. Make sure the young adult knows that smoking tobacco or marijuana and taking drugs have health consequences and may impact brain development.   Teach the young adult about appropriate use of over-the-counter or prescription medicines.   Establish guidelines for driving and for riding with friends.   Talk to young adults about the risks of drinking and driving or boating. Encourage the young adult to call you if he or she or friends have been drinking or using drugs.   Remind young adults to wear seat belts at all times in cars and life vests in boats.   Young adults should always wear a properly fitted helmet when they are riding a bicycle.   Use caution with all-terrain vehicles (ATVs) or other motorized vehicles.   Do not keep handguns in the home. (If you do, the gun and ammunition should be locked separately and out of the young adult's access.)   Equip your home with smoke detectors and change the batteries regularly. Make sure all family members know the fire escape plans for your home.   Teach young adults not to swim alone and not to dive in shallow water.   All individuals should wear sunscreen that protects against UVA and UVB light with at least a sun protection factor (SPF) of 30 when out in the sun. This minimizes sun burning.  WHAT'S NEXT? Young adults should visit their pediatrician or family physician yearly. By young adulthood, health care should be transitioned to a family physician or internal medicine specialist. Sexually active females may want to begin annual physical exams with a gynecologist. Document Released: 02/14/2007 Document Revised: 11/08/2011 Document Reviewed: 03/06/2007 Exit[employer] Patient Information 2012 [employer]. 

## 2012-06-13 NOTE — Progress Notes (Signed)
  Subjective:     Krystal Henry is a 21 y.o. female who presents for a postpartum visit. She is 6 weeks postpartum following a low cervical transverse Cesarean section. I have fully reviewed the prenatal and intrapartum course. The delivery was at term. Outcome: repeat cesarean section, low transverse incision. Anesthesia: regional. Postpartum course has been unevetnful. Baby's course has been uneventful. Baby is feeding by bottle - . Bleeding no bleeding. Bowel function is normal. Bladder function is normal. Patient is not sexually active. Contraception method is Nexplanon. Wants to have nexplanon inserted. Will use condoms in meantime.  Postpartum depression screening: negative.  The following portions of the patient's history were reviewed and updated as appropriate: allergies, current medications, past family history, past medical history, past social history, past surgical history and problem list.  Review of Systems Pertinent items are noted in HPI.   Objective:    BP 117/72  Pulse 73  Temp 98.5 F (36.9 C) (Oral)  Resp 16  Ht 5\' 2"  (1.575 m)  Wt 142 lb (64.411 kg)  BMI 25.97 kg/m2  LMP 06/13/2012  Breastfeeding? No  General:  alert and no distress   Breasts:  inspection negative, no nipple discharge or bleeding, no masses or nodularity palpable  Lungs: clear to auscultation bilaterally  Heart:  regular rate and rhythm, S1, S2 normal, no murmur, click, rub or gallop  Abdomen: soft, non-tender; bowel sounds normal; no masses,  no organomegaly   Vulva:  normal  Vagina: normal vagina  Cervix:  no cervical motion tenderness and no lesions  Corpus: normal  Adnexa:  normal adnexa and no mass, fullness, tenderness  Rectal Exam: Not performed.        Assessment:    Normal Postoperative/ postpartum exam. Pap smear not done at today's visit.  Gc/Chlamydia done at pt's request.  Plan:    1. Contraception: Nexplanon 2. Safe sex reviewed. 3. Follow up in: 1 year or as needed.

## 2012-06-18 ENCOUNTER — Ambulatory Visit (INDEPENDENT_AMBULATORY_CARE_PROVIDER_SITE_OTHER): Payer: Medicaid Other | Admitting: Obstetrics & Gynecology

## 2012-06-18 VITALS — BP 116/71 | HR 73 | Temp 98.5°F | Resp 16 | Ht 62.0 in | Wt 142.0 lb

## 2012-06-18 DIAGNOSIS — Z01812 Encounter for preprocedural laboratory examination: Secondary | ICD-10-CM

## 2012-06-18 DIAGNOSIS — Z30017 Encounter for initial prescription of implantable subdermal contraceptive: Secondary | ICD-10-CM

## 2012-06-18 MED ORDER — ETONOGESTREL 68 MG ~~LOC~~ IMPL
68.0000 mg | DRUG_IMPLANT | Freq: Once | SUBCUTANEOUS | Status: AC
  Administered 2012-06-18: 68 mg via SUBCUTANEOUS

## 2012-06-18 NOTE — Progress Notes (Signed)
  Subjective:    Patient ID: Krystal Henry, female    DOB: [birth year], 21 y.o.   MRN: [id number]  HPI  She is here for a Nexplanon insertion. She is not sexually active yet. She is aware of the risk of irregular vaginal bleeding with Nexplanon and wishes to proceed.  Review of Systems     Objective:   Physical Exam A UPT was negative, consent was signed, a time out done She prefers her right arm as she is getting a tattoo sleeve on the left in the near future. The right arm was prepped about 8 cm above the elbow. It was infiltrated with 1% lidocaine. A Nexplanon was easily placed and hemostasis was noted. Her arm was bandaged. She tolerated the procedure well.        Assessment & Plan:   Contraception- Nexplanon placed. I have advised back up contraception for 1 month.

## 2012-06-18 NOTE — Addendum Note (Signed)
Addended by: Granville Lewis on: 06/18/2012 12:02 PM   Modules accepted: Orders

## 2012-06-19 ENCOUNTER — Encounter: Payer: Self-pay | Admitting: *Deleted

## 2014-10-04 ENCOUNTER — Encounter: Payer: Self-pay | Admitting: Advanced Practice Midwife

## 2015-10-01 DIAGNOSIS — Y9389 Activity, other specified: Secondary | ICD-10-CM | POA: Insufficient documentation

## 2015-10-01 DIAGNOSIS — Z23 Encounter for immunization: Secondary | ICD-10-CM | POA: Insufficient documentation

## 2015-10-01 DIAGNOSIS — Z8619 Personal history of other infectious and parasitic diseases: Secondary | ICD-10-CM | POA: Insufficient documentation

## 2015-10-01 DIAGNOSIS — Z87891 Personal history of nicotine dependence: Secondary | ICD-10-CM | POA: Insufficient documentation

## 2015-10-01 DIAGNOSIS — S61212A Laceration without foreign body of right middle finger without damage to nail, initial encounter: Secondary | ICD-10-CM | POA: Insufficient documentation

## 2015-10-01 DIAGNOSIS — S61214A Laceration without foreign body of right ring finger without damage to nail, initial encounter: Secondary | ICD-10-CM | POA: Insufficient documentation

## 2015-10-01 DIAGNOSIS — Y9289 Other specified places as the place of occurrence of the external cause: Secondary | ICD-10-CM | POA: Insufficient documentation

## 2015-10-01 DIAGNOSIS — F419 Anxiety disorder, unspecified: Secondary | ICD-10-CM | POA: Insufficient documentation

## 2015-10-01 DIAGNOSIS — S61219A Laceration without foreign body of unspecified finger without damage to nail, initial encounter: Secondary | ICD-10-CM

## 2015-10-01 DIAGNOSIS — W260XXA Contact with knife, initial encounter: Secondary | ICD-10-CM | POA: Insufficient documentation

## 2015-10-01 DIAGNOSIS — Y998 Other external cause status: Secondary | ICD-10-CM | POA: Insufficient documentation

## 2015-10-01 DIAGNOSIS — R000 Tachycardia, unspecified: Secondary | ICD-10-CM | POA: Insufficient documentation

## 2015-10-01 DIAGNOSIS — S61210A Laceration without foreign body of right index finger without damage to nail, initial encounter: Secondary | ICD-10-CM | POA: Insufficient documentation

## 2015-10-01 HISTORY — DX: Laceration without foreign body of unspecified finger without damage to nail, initial encounter: S61.219A

## 2015-10-02 ENCOUNTER — Encounter (HOSPITAL_COMMUNITY): Payer: Self-pay | Admitting: Emergency Medicine

## 2015-10-02 ENCOUNTER — Emergency Department (HOSPITAL_COMMUNITY)
Admission: EM | Admit: 2015-10-02 | Discharge: 2015-10-02 | Disposition: A | Discharge status: Home / Self Care | Payer: Self-pay | Attending: Emergency Medicine | Admitting: Emergency Medicine

## 2015-10-02 ENCOUNTER — Emergency Department (HOSPITAL_COMMUNITY): Payer: Medicaid Other

## 2015-10-02 DIAGNOSIS — S61411A Laceration without foreign body of right hand, initial encounter: Secondary | ICD-10-CM

## 2015-10-02 DIAGNOSIS — IMO0002 Reserved for concepts with insufficient information to code with codable children: Secondary | ICD-10-CM

## 2015-10-02 MED ORDER — OXYCODONE-ACETAMINOPHEN 5-325 MG PO TABS
1.0000 | ORAL_TABLET | Freq: Once | ORAL | Status: AC
  Administered 2015-10-02: 1 via ORAL
  Filled 2015-10-02: qty 1

## 2015-10-02 MED ORDER — OXYCODONE-ACETAMINOPHEN 5-325 MG PO TABS: 1.0000 | ORAL_TABLET | Freq: Four times a day (QID) | ORAL | Status: DC | PRN

## 2015-10-02 MED ORDER — LIDOCAINE-EPINEPHRINE (PF) 2 %-1:200000 IJ SOLN
INTRAMUSCULAR | Status: AC
  Filled 2015-10-02: qty 20

## 2015-10-02 MED ORDER — TETANUS-DIPHTH-ACELL PERTUSSIS 5-2.5-18.5 LF-MCG/0.5 IM SUSP
0.5000 mL | Freq: Once | INTRAMUSCULAR | Status: AC
  Administered 2015-10-02: 0.5 mL via INTRAMUSCULAR
  Filled 2015-10-02: qty 0.5

## 2015-10-02 MED ORDER — ONDANSETRON 4 MG PO TBDP
4.0000 mg | ORAL_TABLET | Freq: Once | ORAL | Status: AC
  Administered 2015-10-02: 4 mg via ORAL
  Filled 2015-10-02: qty 1

## 2015-10-02 NOTE — Discharge Instructions (Signed)
Suture removal in 10 days.  FOllow-up with Hand.  Laceration Care, Adult A laceration is a cut that goes through all layers of the skin. The cut also goes into the tissue that is right under the skin. Some cuts heal on their own. Others need to be closed with stitches (sutures), staples, skin adhesive strips, or wound glue. Taking care of your cut lowers your risk of infection and helps your cut to heal better. HOW TO TAKE CARE OF YOUR CUT For stitches or staples:  Keep the wound clean and dry.  If you were given a bandage (dressing), you should change it at least one time per day or as told by your doctor. You should also change it if it gets wet or dirty.  Keep the wound completely dry for the first 24 hours or as told by your doctor. After that time, you may take a shower or a bath. However, make sure that the wound is not soaked in water until after the stitches or staples have been removed.  Clean the wound one time each day or as told by your doctor:  Wash the wound with soap and water.  Rinse the wound with water until all of the soap comes off.  Pat the wound dry with a clean towel. Do not rub the wound.  After you clean the wound, put a thin layer of antibiotic ointment on it as told by your doctor. This ointment:  Helps to prevent infection.  Keeps the bandage from sticking to the wound.  Have your stitches or staples removed as told by your doctor. If your doctor used skin adhesive strips:   Keep the wound clean and dry.  If you were given a bandage, you should change it at least one time per day or as told by your doctor. You should also change it if it gets dirty or wet.  Do not get the skin adhesive strips wet. You can take a shower or a bath, but be careful to keep the wound dry.  If the wound gets wet, pat it dry with a clean towel. Do not rub the wound.  Skin adhesive strips fall off on their own. You can trim the strips as the wound heals. Do not remove any  strips that are still stuck to the wound. They will fall off after a while. If your doctor used wound glue:  Try to keep your wound dry, but you may briefly wet it in the shower or bath. Do not soak the wound in water, such as by swimming.  After you take a shower or a bath, gently pat the wound dry with a clean towel. Do not rub the wound.  Do not do any activities that will make you really sweaty until the skin glue has fallen off on its own.  Do not apply liquid, cream, or ointment medicine to your wound while the skin glue is still on.  If you were given a bandage, you should change it at least one time per day or as told by your doctor. You should also change it if it gets dirty or wet.  If a bandage is placed over the wound, do not let the tape for the bandage touch the skin glue.  Do not pick at the glue. The skin glue usually stays on for 5-10 days. Then, it falls off of the skin. General Instructions  To help prevent scarring, make sure to cover your wound with sunscreen whenever you are  outside after stitches are removed, after adhesive strips are removed, or when wound glue stays in place and the wound is healed. Make sure to wear a sunscreen of at least 30 SPF.  Take over-the-counter and prescription medicines only as told by your doctor.  If you were given antibiotic medicine or ointment, take or apply it as told by your doctor. Do not stop using the antibiotic even if your wound is getting better.  Do not scratch or pick at the wound.  Keep all follow-up visits as told by your doctor. This is important.  Check your wound every day for signs of infection. Watch for:  Redness, swelling, or pain.  Fluid, blood, or pus.  Raise (elevate) the injured area above the level of your heart while you are sitting or lying down, if possible. GET HELP IF:  You got a tetanus shot and you have any of these problems at the injection site:  Swelling.  Very bad  pain.  Redness.  Bleeding.  You have a fever.  A wound that was closed breaks open.  You notice a bad smell coming from your wound or your bandage.  You notice something coming out of the wound, such as wood or glass.  Medicine does not help your pain.  You have more redness, swelling, or pain at the site of your wound.  You have fluid, blood, or pus coming from your wound.  You notice a change in the color of your skin near your wound.  You need to change the bandage often because fluid, blood, or pus is coming from the wound.  You start to have a new rash.  You start to have numbness around the wound. GET HELP RIGHT AWAY IF:  You have very bad swelling around the wound.  Your pain suddenly gets worse and is very bad.  You notice painful lumps near the wound or on skin that is anywhere on your body.  You have a red streak going away from your wound.  The wound is on your hand or foot and you cannot move a finger or toe like you usually can.  The wound is on your hand or foot and you notice that your fingers or toes look pale or bluish.   This information is not intended to replace advice given to you by your health care provider. Make sure you discuss any questions you have with your health care provider.   Document Released: 05/07/2008 Document Revised: 04/05/2015 Document Reviewed: 11/15/2014 Elsevier Interactive Patient Education [employer]

## 2015-10-02 NOTE — ED Provider Notes (Signed)
CSN: [id number]     Arrival date & time 10/01/15  2352 History  By signing my name below, I, Phillis Haggis, attest that this documentation has been prepared under the direction and in the presence of Shon Baton, MD. Electronically Signed: Phillis Haggis, ED Scribe. 10/02/2015. 1:39 AM.  Chief Complaint  Patient presents with  . Finger Injury  . Extremity Laceration   The history is provided by the patient. No language interpreter was used.   HPI Comments: Krystal Henry is a 24 y.o. female brought in by EMS who presents to the Emergency Department complaining of extremity laceration onset PTA. Pt states that she was trying to slash her significant other's tires with a knife when it slipped and cut the fingers of her right hand. Pt reports burning pain to the finger. Pt reports to drinking alcohol tonight prior to the incident. She denies other injury, numbness or weakness. She states that she is right hand dominant and does not know when her last tdap was.   Past Medical History  Diagnosis Date  . Type o blood, rh negative   . Headache(784.0)   . Chlamydia current    new antibiotic to start today 04/23/12   Past Surgical History  Procedure Laterality Date  . Cesarean section  2011  . Cesarean section  05/01/2012    Procedure: CESAREAN SECTION;  Surgeon: Lesly Dukes, MD;  Location: [facility];  Service: Gynecology;  Laterality: N/A;   Family History  Problem Relation Age of Onset  . Hypertension Paternal Grandmother   . Hypertension Paternal Grandfather    Social History  Substance Use Topics  . Smoking status: Former Smoker -- 0.50 packs/day for 6 years    Types: Cigarettes    Quit date: 08/28/2011  . Smokeless tobacco: None  . Alcohol Use: No   OB History    Gravida Para Term Preterm AB TAB SAB Ectopic Multiple Living   0 0 0 0 0 0 2     Review of Systems  Skin: Positive for wound.  Neurological: Negative for weakness and numbness.  All other systems  reviewed and are negative.  Allergies  Review of patient's allergies indicates no known allergies.  Home Medications   Prior to Admission medications   Medication Sig Start Date End Date Taking? Authorizing Provider  etonogestrel (IMPLANON) 68 MG IMPL implant 1 each by Subdermal route once.   Yes Historical Provider, MD  oxyCODONE-acetaminophen (PERCOCET/ROXICET) 5-325 MG tablet Take 1 tablet by mouth every 6 (six) hours as needed for severe pain. 10/02/15   Shon Baton, MD   BP 137/94 mmHg  Pulse 103  Temp(Src) 98.4 F (36.9 C) (Oral)  Resp 32  Ht  (1.6 m)  Wt 130 lb (58.968 kg)  BMI 23.03 kg/m2  SpO2 98% Physical Exam  Constitutional: She is oriented to person, place, and time.  Tearful, anxious  HENT:  Head: Normocephalic and atraumatic.  Cardiovascular: Regular rhythm.   Tachycardia  Pulmonary/Chest: Effort normal. No respiratory distress.  Musculoskeletal:  Focused examination the patient of the right hand reveals linear lacerations (2nd digit: 1.5 cm,3rd digit 1.0 cm, 4th digit, 0.5 cm) consistent with knife over the palmar aspect of the second and third digit, injury over the second digit is just proximal to the PIP joint, flexion and extension appear to be intact. Injury over the third digit just distal to the PIP joint, flexion and extension intact, small arterial bleeder noted  Neurological: She is  alert and oriented to person, place, and time.  Skin: Skin is warm and dry.  Psychiatric:  Anxious, tearful  Nursing note and vitals reviewed.   ED Course  Procedures (including critical care time) DIAGNOSTIC STUDIES: Oxygen Saturation is 100% on RA, normal by my interpretation.    COORDINATION OF CARE: 12:12 AM-Discussed treatment plan which includes cleaning of wound, laceration repair, and x-ray with pt at bedside and pt agreed to plan.   LACERATION REPAIR Performed by: Shon Batonourtney F Laisha Rau, MD, PA student Consent: Verbal consent obtained. Risks and  benefits: risks, benefits and alternatives were discussed Patient identity confirmed: provided demographic data Time out performed prior to procedure Prepped and Draped in normal sterile fashion Wound explored Laceration Location: fingers of right hand Laceration Length: 1.5 cm, 1.0 cm, 0.5 cm No Foreign Bodies seen or palpated Anesthesia: local infiltration Local anesthetic: lidocaine 2% w/ epinephrine Irrigation method: syringe Amount of cleaning: standard Skin closure: 4-0 Nylon Number of sutures or staples: 11 total  Technique: simple interrupted Patient tolerance: Patient tolerated the procedure well with no immediate complications.   Labs Review Labs Reviewed - No data to display  Imaging Review Dg Hand Complete Right  10/02/2015  CLINICAL DATA:  Cut hand while attempting to slash tire. Pain first 2 fingers. EXAM: RIGHT HAND - COMPLETE 3+ VIEW COMPARISON:  None. FINDINGS: There is no evidence of fracture or dislocation. There is no evidence of arthropathy or other focal bone abnormality. Bandage overlies the second and third digit, with associated soft tissue swelling, no definite subcutaneous gas radiopaque foreign bodies. IMPRESSION: Second and third digit soft tissue swelling with bandage, no acute fracture deformity or dislocation. Electronically Signed   By: Awilda Metroourtnay  Bloomer M.D.   On: 10/02/2015 01:32   I have personally reviewed and evaluated these images and lab results as part of my medical decision-making.   EKG Interpretation None      MDM   Final diagnoses:  Hand laceration, right, initial encounter   Patient presents with a hand laceration. Sustained when trying to cut her boyfriend's tires. Brisk bleeder noted on initial exam. This was tied off with a Vicryl stitch. Extension and flexion appear to be intact. Plain films are negative. Laceration was repaired at the bedside by the PA student under my and [place], PA supervision. Discuss with patient wound care  and removal of stitches in 10 days. Hand follow-up was provided.  After history, exam, and medical workup I feel the patient has been appropriately medically screened and is safe for discharge home. Pertinent diagnoses were discussed with the patient. Patient was given return precautions.  I personally performed the services described in this documentation, which was scribed in my presence. The recorded information has been reviewed and is accurate.   Shon Batonourtney F Bernadine Melecio, MD 10/02/15 2325

## 2015-10-04 DIAGNOSIS — N83209 Unspecified ovarian cyst, unspecified side: Secondary | ICD-10-CM

## 2015-10-04 HISTORY — DX: Unspecified ovarian cyst, unspecified side: N83.209

## 2015-10-10 ENCOUNTER — Other Ambulatory Visit: Payer: Self-pay | Admitting: Orthopedic Surgery

## 2015-10-11 ENCOUNTER — Encounter (HOSPITAL_BASED_OUTPATIENT_CLINIC_OR_DEPARTMENT_OTHER): Payer: Self-pay | Admitting: *Deleted

## 2015-10-13 ENCOUNTER — Ambulatory Visit (HOSPITAL_BASED_OUTPATIENT_CLINIC_OR_DEPARTMENT_OTHER)
Admission: RE | Admit: 2015-10-13 | Discharge: 2015-10-13 | Disposition: A | Discharge status: Home / Self Care | Payer: Self-pay | Source: Ambulatory Visit | Attending: Orthopedic Surgery | Admitting: Orthopedic Surgery

## 2015-10-13 ENCOUNTER — Ambulatory Visit (HOSPITAL_BASED_OUTPATIENT_CLINIC_OR_DEPARTMENT_OTHER): Payer: Self-pay | Admitting: Anesthesiology

## 2015-10-13 ENCOUNTER — Encounter (HOSPITAL_BASED_OUTPATIENT_CLINIC_OR_DEPARTMENT_OTHER)
Admission: RE | Disposition: A | Discharge status: Home / Self Care | Payer: Self-pay | Source: Ambulatory Visit | Attending: Orthopedic Surgery

## 2015-10-13 ENCOUNTER — Ambulatory Visit (HOSPITAL_BASED_OUTPATIENT_CLINIC_OR_DEPARTMENT_OTHER): Payer: Medicaid Other | Admitting: Anesthesiology

## 2015-10-13 ENCOUNTER — Encounter (HOSPITAL_BASED_OUTPATIENT_CLINIC_OR_DEPARTMENT_OTHER): Payer: Self-pay

## 2015-10-13 DIAGNOSIS — S65510A Laceration of blood vessel of right index finger, initial encounter: Secondary | ICD-10-CM | POA: Insufficient documentation

## 2015-10-13 DIAGNOSIS — W260XXA Contact with knife, initial encounter: Secondary | ICD-10-CM | POA: Insufficient documentation

## 2015-10-13 DIAGNOSIS — S64490A Injury of digital nerve of right index finger, initial encounter: Secondary | ICD-10-CM | POA: Insufficient documentation

## 2015-10-13 DIAGNOSIS — S61214A Laceration without foreign body of right ring finger without damage to nail, initial encounter: Secondary | ICD-10-CM | POA: Insufficient documentation

## 2015-10-13 DIAGNOSIS — S61210A Laceration without foreign body of right index finger without damage to nail, initial encounter: Secondary | ICD-10-CM | POA: Insufficient documentation

## 2015-10-13 DIAGNOSIS — S65512A Laceration of blood vessel of right middle finger, initial encounter: Secondary | ICD-10-CM | POA: Insufficient documentation

## 2015-10-13 DIAGNOSIS — S66122A Laceration of flexor muscle, fascia and tendon of right middle finger at wrist and hand level, initial encounter: Secondary | ICD-10-CM | POA: Insufficient documentation

## 2015-10-13 DIAGNOSIS — F17210 Nicotine dependence, cigarettes, uncomplicated: Secondary | ICD-10-CM | POA: Insufficient documentation

## 2015-10-13 DIAGNOSIS — S66120A Laceration of flexor muscle, fascia and tendon of right index finger at wrist and hand level, initial encounter: Secondary | ICD-10-CM | POA: Insufficient documentation

## 2015-10-13 DIAGNOSIS — S64492A Injury of digital nerve of right middle finger, initial encounter: Secondary | ICD-10-CM | POA: Insufficient documentation

## 2015-10-13 DIAGNOSIS — S61212A Laceration without foreign body of right middle finger without damage to nail, initial encounter: Secondary | ICD-10-CM | POA: Insufficient documentation

## 2015-10-13 HISTORY — DX: Unspecified ovarian cyst, unspecified side: N83.209

## 2015-10-13 HISTORY — PX: NERVE, TENDON AND ARTERY REPAIR: SHX5695

## 2015-10-13 HISTORY — DX: Laceration without foreign body of unspecified finger without damage to nail, initial encounter: S61.219A

## 2015-10-13 SURGERY — NERVE, TENDON AND ARTERY REPAIR
Anesthesia: General | Site: Finger | Laterality: Right

## 2015-10-13 MED ORDER — MIDAZOLAM HCL 5 MG/5ML IJ SOLN
INTRAMUSCULAR | Status: DC | PRN
  Administered 2015-10-13: 2 mg via INTRAVENOUS

## 2015-10-13 MED ORDER — OXYCODONE HCL 5 MG PO TABS
5.0000 mg | ORAL_TABLET | Freq: Once | ORAL | Status: AC
  Administered 2015-10-13: 5 mg via ORAL

## 2015-10-13 MED ORDER — KETOROLAC TROMETHAMINE 30 MG/ML IJ SOLN
INTRAMUSCULAR | Status: AC
  Filled 2015-10-13: qty 1

## 2015-10-13 MED ORDER — PROPOFOL 10 MG/ML IV BOLUS
INTRAVENOUS | Status: DC | PRN
  Administered 2015-10-13: 200 mg via INTRAVENOUS
  Administered 2015-10-13: 100 mg via INTRAVENOUS
  Administered 2015-10-13: 50 mg via INTRAVENOUS

## 2015-10-13 MED ORDER — MIDAZOLAM HCL 2 MG/2ML IJ SOLN: 1.0000 mg | INTRAMUSCULAR | Status: DC | PRN

## 2015-10-13 MED ORDER — CHLORHEXIDINE GLUCONATE 4 % EX LIQD: 60.0000 mL | Freq: Once | CUTANEOUS | Status: DC

## 2015-10-13 MED ORDER — LIDOCAINE HCL (CARDIAC) 20 MG/ML IV SOLN
INTRAVENOUS | Status: AC
Start: 2015-10-13 — End: 2015-10-13
  Filled 2015-10-13: qty 5

## 2015-10-13 MED ORDER — ONDANSETRON HCL 4 MG/2ML IJ SOLN: 4.0000 mg | Freq: Once | INTRAMUSCULAR | Status: DC | PRN

## 2015-10-13 MED ORDER — PROPOFOL 10 MG/ML IV BOLUS
INTRAVENOUS | Status: AC
  Filled 2015-10-13: qty 20

## 2015-10-13 MED ORDER — ATROPINE SULFATE 0.4 MG/ML IJ SOLN
INTRAMUSCULAR | Status: AC
  Filled 2015-10-13: qty 1

## 2015-10-13 MED ORDER — KETOROLAC TROMETHAMINE 30 MG/ML IJ SOLN
INTRAMUSCULAR | Status: DC | PRN
  Administered 2015-10-13: 30 mg via INTRAVENOUS

## 2015-10-13 MED ORDER — ONDANSETRON HCL 4 MG/2ML IJ SOLN
INTRAMUSCULAR | Status: AC
  Filled 2015-10-13: qty 2

## 2015-10-13 MED ORDER — PHENYLEPHRINE HCL 10 MG/ML IJ SOLN
INTRAMUSCULAR | Status: AC
  Filled 2015-10-13: qty 1

## 2015-10-13 MED ORDER — BUPIVACAINE HCL (PF) 0.25 % IJ SOLN
INTRAMUSCULAR | Status: DC | PRN
  Administered 2015-10-13: 10 mL

## 2015-10-13 MED ORDER — FENTANYL CITRATE (PF) 100 MCG/2ML IJ SOLN
INTRAMUSCULAR | Status: AC
  Filled 2015-10-13: qty 4

## 2015-10-13 MED ORDER — MEPERIDINE HCL 25 MG/ML IJ SOLN: 6.2500 mg | INTRAMUSCULAR | Status: DC | PRN

## 2015-10-13 MED ORDER — CEFAZOLIN SODIUM-DEXTROSE 2-3 GM-% IV SOLR
2.0000 g | INTRAVENOUS | Status: AC
  Administered 2015-10-13: 2 g via INTRAVENOUS

## 2015-10-13 MED ORDER — FENTANYL CITRATE (PF) 100 MCG/2ML IJ SOLN: 50.0000 ug | INTRAMUSCULAR | Status: DC | PRN

## 2015-10-13 MED ORDER — LIDOCAINE HCL (CARDIAC) 20 MG/ML IV SOLN
INTRAVENOUS | Status: DC | PRN
  Administered 2015-10-13: 50 mg via INTRAVENOUS

## 2015-10-13 MED ORDER — FENTANYL CITRATE (PF) 100 MCG/2ML IJ SOLN
INTRAMUSCULAR | Status: DC | PRN
  Administered 2015-10-13 (×4): 50 ug via INTRAVENOUS
  Administered 2015-10-13: 100 ug via INTRAVENOUS

## 2015-10-13 MED ORDER — HYDROCODONE-ACETAMINOPHEN 5-325 MG PO TABS: ORAL_TABLET | ORAL | Status: AC

## 2015-10-13 MED ORDER — OXYCODONE HCL 5 MG PO TABS
ORAL_TABLET | ORAL | Status: AC
  Filled 2015-10-13: qty 1

## 2015-10-13 MED ORDER — SCOPOLAMINE 1 MG/3DAYS TD PT72: 1.0000 | MEDICATED_PATCH | Freq: Once | TRANSDERMAL | Status: DC | PRN

## 2015-10-13 MED ORDER — HYDROMORPHONE HCL 1 MG/ML IJ SOLN
INTRAMUSCULAR | Status: AC
  Filled 2015-10-13: qty 1

## 2015-10-13 MED ORDER — GLYCOPYRROLATE 0.2 MG/ML IJ SOLN: 0.2000 mg | Freq: Once | INTRAMUSCULAR | Status: DC | PRN

## 2015-10-13 MED ORDER — HYDROMORPHONE HCL 1 MG/ML IJ SOLN
0.2500 mg | INTRAMUSCULAR | Status: DC | PRN
  Administered 2015-10-13: 0.25 mg via INTRAVENOUS

## 2015-10-13 MED ORDER — ONDANSETRON HCL 4 MG/2ML IJ SOLN
INTRAMUSCULAR | Status: DC | PRN
  Administered 2015-10-13: 4 mg via INTRAVENOUS

## 2015-10-13 MED ORDER — SUCCINYLCHOLINE CHLORIDE 20 MG/ML IJ SOLN
INTRAMUSCULAR | Status: AC
  Filled 2015-10-13: qty 1

## 2015-10-13 MED ORDER — GLYCOPYRROLATE 0.2 MG/ML IJ SOLN
INTRAMUSCULAR | Status: AC
  Filled 2015-10-13: qty 1

## 2015-10-13 MED ORDER — MIDAZOLAM HCL 2 MG/2ML IJ SOLN
INTRAMUSCULAR | Status: AC
  Filled 2015-10-13: qty 4

## 2015-10-13 MED ORDER — DEXAMETHASONE SODIUM PHOSPHATE 10 MG/ML IJ SOLN
INTRAMUSCULAR | Status: AC
  Filled 2015-10-13: qty 1

## 2015-10-13 MED ORDER — DEXAMETHASONE SODIUM PHOSPHATE 10 MG/ML IJ SOLN
INTRAMUSCULAR | Status: DC | PRN
  Administered 2015-10-13: 10 mg via INTRAVENOUS

## 2015-10-13 MED ORDER — LACTATED RINGERS IV SOLN
INTRAVENOUS | Status: DC
  Administered 2015-10-13 (×2): via INTRAVENOUS

## 2015-10-13 MED ORDER — HEPARIN SODIUM (PORCINE) 1000 UNIT/ML IJ SOLN
INTRAMUSCULAR | Status: AC
  Filled 2015-10-13: qty 1

## 2015-10-13 MED ORDER — EPHEDRINE SULFATE 50 MG/ML IJ SOLN
INTRAMUSCULAR | Status: AC
  Filled 2015-10-13: qty 1

## 2015-10-13 MED ORDER — CEFAZOLIN SODIUM-DEXTROSE 2-3 GM-% IV SOLR
INTRAVENOUS | Status: AC
  Filled 2015-10-13: qty 50

## 2015-10-13 SURGICAL SUPPLY — 61 items
BAG DECANTER FOR FLEXI CONT (MISCELLANEOUS) IMPLANT
BANDAGE ELASTIC 3 VELCRO ST LF (GAUZE/BANDAGES/DRESSINGS) ×3 IMPLANT
BLADE MINI RND TIP GREEN BEAV (BLADE) IMPLANT
BLADE SURG 15 STRL LF DISP TIS (BLADE) ×2 IMPLANT
BLADE SURG 15 STRL SS (BLADE) ×4
BNDG ESMARK 4X9 LF (GAUZE/BANDAGES/DRESSINGS) ×3 IMPLANT
BNDG GAUZE ELAST 4 BULKY (GAUZE/BANDAGES/DRESSINGS) ×3 IMPLANT
BRUSH SCRUB EZ PLAIN DRY (MISCELLANEOUS) ×3 IMPLANT
CATH ROBINSON RED A/P 10FR (CATHETERS) IMPLANT
CHLORAPREP W/TINT 26ML (MISCELLANEOUS) IMPLANT
CORDS BIPOLAR (ELECTRODE) ×3 IMPLANT
COVER BACK TABLE 60X90IN (DRAPES) ×3 IMPLANT
COVER MAYO STAND STRL (DRAPES) ×3 IMPLANT
CUFF TOURNIQUET SINGLE 18IN (TOURNIQUET CUFF) ×3 IMPLANT
DECANTER SPIKE VIAL GLASS SM (MISCELLANEOUS) ×3 IMPLANT
DRAPE EXTREMITY T 121X128X90 (DRAPE) ×3 IMPLANT
DRAPE SURG 17X23 STRL (DRAPES) ×3 IMPLANT
GAUZE SPONGE 4X4 12PLY STRL (GAUZE/BANDAGES/DRESSINGS) ×3 IMPLANT
GAUZE XEROFORM 1X8 LF (GAUZE/BANDAGES/DRESSINGS) ×3 IMPLANT
GLOVE BIO SURGEON STRL SZ7.5 (GLOVE) ×3 IMPLANT
GLOVE BIOGEL PI IND STRL 8 (GLOVE) ×1 IMPLANT
GLOVE BIOGEL PI IND STRL 8.5 (GLOVE) ×1 IMPLANT
GLOVE BIOGEL PI INDICATOR 8 (GLOVE) ×2
GLOVE BIOGEL PI INDICATOR 8.5 (GLOVE) ×2
GLOVE SURG ORTHO 8.0 STRL STRW (GLOVE) ×3 IMPLANT
GOWN STRL REUS W/ TWL LRG LVL3 (GOWN DISPOSABLE) ×1 IMPLANT
GOWN STRL REUS W/TWL LRG LVL3 (GOWN DISPOSABLE) ×2
GOWN STRL REUS W/TWL XL LVL3 (GOWN DISPOSABLE) ×6 IMPLANT
LOOP VESSEL MAXI BLUE (MISCELLANEOUS) IMPLANT
NDL SAFETY ECLIPSE 18X1.5 (NEEDLE) IMPLANT
NEEDLE HYPO 18GX1.5 SHARP (NEEDLE)
NEEDLE HYPO 25X1 1.5 SAFETY (NEEDLE) ×3 IMPLANT
NS IRRIG 1000ML POUR BTL (IV SOLUTION) ×3 IMPLANT
PACK BASIN DAY SURGERY FS (CUSTOM PROCEDURE TRAY) ×3 IMPLANT
PAD CAST 3X4 CTTN HI CHSV (CAST SUPPLIES) ×1 IMPLANT
PAD CAST 4YDX4 CTTN HI CHSV (CAST SUPPLIES) IMPLANT
PADDING CAST ABS 4INX4YD NS (CAST SUPPLIES) ×2
PADDING CAST ABS COTTON 4X4 ST (CAST SUPPLIES) ×1 IMPLANT
PADDING CAST COTTON 3X4 STRL (CAST SUPPLIES) ×2
PADDING CAST COTTON 4X4 STRL (CAST SUPPLIES)
SLEEVE SCD COMPRESS KNEE MED (MISCELLANEOUS) ×3 IMPLANT
SPEAR EYE SURG WECK-CEL (MISCELLANEOUS) ×3 IMPLANT
SPLINT PLASTER CAST XFAST 3X15 (CAST SUPPLIES) IMPLANT
SPLINT PLASTER XTRA FASTSET 3X (CAST SUPPLIES)
STOCKINETTE 4X48 STRL (DRAPES) ×3 IMPLANT
SUT CHROMIC 5 0 P 3 (SUTURE) ×3 IMPLANT
SUT ETHIBOND 3-0 V-5 (SUTURE) IMPLANT
SUT ETHILON 4 0 PS 2 18 (SUTURE) ×3 IMPLANT
SUT FIBERWIRE 4-0 18 TAPR NDL (SUTURE)
SUT MERSILENE 6 0 P 1 (SUTURE) IMPLANT
SUT NYLON 9 0 VRM6 (SUTURE) ×6 IMPLANT
SUT PROLENE 6 0 P 1 18 (SUTURE) ×3 IMPLANT
SUT SILK 4 0 PS 2 (SUTURE) ×3 IMPLANT
SUT SUPRAMID 4-0 (SUTURE) ×9 IMPLANT
SUT VICRYL 4-0 PS2 18IN ABS (SUTURE) IMPLANT
SUTURE FIBERWR 4-0 18 TAPR NDL (SUTURE) IMPLANT
SYR BULB 3OZ (MISCELLANEOUS) ×3 IMPLANT
SYR CONTROL 10ML LL (SYRINGE) ×3 IMPLANT
TOWEL OR 17X24 6PK STRL BLUE (TOWEL DISPOSABLE) ×6 IMPLANT
TRAY DSU PREP LF (CUSTOM PROCEDURE TRAY) ×6 IMPLANT
UNDERPAD 30X30 (UNDERPADS AND DIAPERS) ×3 IMPLANT

## 2015-10-13 NOTE — Discharge Instructions (Addendum)

## 2015-10-13 NOTE — Anesthesia Procedure Notes (Signed)
Procedure Name: LMA Insertion Date/Time: 10/13/2015 3:05 PM Performed by: Genevieve NorlanderLINKA, Janiaya Ryser L Pre-anesthesia Checklist: Patient identified, Emergency Drugs available, Suction available, Patient being monitored and Timeout performed Patient Re-evaluated:Patient Re-evaluated prior to inductionOxygen Delivery Method: Circle System Utilized Preoxygenation: Pre-oxygenation with 100% oxygen Intubation Type: IV induction Ventilation: Mask ventilation without difficulty LMA: LMA inserted LMA Size: 3.0 Number of attempts: 1 Airway Equipment and Method: Bite block Placement Confirmation: positive ETCO2 Tube secured with: Tape Dental Injury: Teeth and Oropharynx as per pre-operative assessment

## 2015-10-13 NOTE — Brief Op Note (Signed)
10/13/2015  5:30 PM  PATIENT:  Krystal Henry  24 y.o. female  PRE-OPERATIVE DIAGNOSIS:  RIGHT INDEX, LONG, RING FINGER TENDON, ARTERY. NERVE LACERATIONS  POST-OPERATIVE DIAGNOSIS:  RIGHT INDEX, LONG, RING FINGER TENDON, ARTERY. NERVE LACERATIONS  PROCEDURE:  Procedure(s): RIGHT INDEX LONG RING FINGER REPAIR, TENDON, ARTERY AND NERVE, REPAIR (Right)  SURGEON:  Surgeon(s) and Role:    * Betha LoaKevin Jaymian Bogart, MD - Primary    * Cindee SaltGary Andera Cranmer, MD - Assisting  PHYSICIAN ASSISTANT:   ASSISTANTS: Cindee SaltGary Hiliary Osorto, MD   ANESTHESIA:   general  EBL:  Total I/O In: 1500 [I.V.:1500] Out: -   BLOOD ADMINISTERED:none  DRAINS: none   LOCAL MEDICATIONS USED:  MARCAINE     SPECIMEN:  No Specimen  DISPOSITION OF SPECIMEN:  N/A  COUNTS:  YES  TOURNIQUET:   Total Tourniquet Time Documented: Upper Arm (Right) - 116 minutes Total: Upper Arm (Right) - 116 minutes   DICTATION: .Other Dictation: Dictation Number [id number]  PLAN OF CARE: Discharge to home after PACU  PATIENT DISPOSITION:  PACU - hemodynamically stable.

## 2015-10-13 NOTE — Transfer of Care (Signed)
Immediate Anesthesia Transfer of Care Note  Patient: Krystal Henry, Krystal Henry  Procedure(s) Performed: Procedure(s): RIGHT INDEX LONG RING FINGER REPAIR, TENDON, ARTERY AND NERVE, REPAIR (Right)  Patient Location: PACU  Anesthesia Type:General  Level of Consciousness: awake and sedated  Airway & Oxygen Therapy: Patient Spontanous Breathing and Patient connected to face mask oxygen  Post-op Assessment: Report given to RN and Post -op Vital signs reviewed and stable  Post vital signs: Reviewed and stable  Last Vitals:  Filed Vitals:   10/13/15 1230  BP: 124/73  Pulse: 69  Temp: 36.9 C  Resp: 20    Complications: No apparent anesthesia complications

## 2015-10-13 NOTE — Op Note (Signed)
[id number] 

## 2015-10-13 NOTE — H&P (Signed)
  Krystal JohannCharly Henry is an 24 y.o. female.   Chief Complaint: right index/long/ring finger lacerations HPI: 24 yo rhd female states she lacerated right index/long/ring fingers 10/02/15 with a knife.  [facility] and [facility].  Followed up in office.  Inability to flex digits.  She reports no previous injury to fingers and no other injury at this time.   Past Medical History  Diagnosis Date  . Finger laceration involving tendon 10/01/2015    lac. right index/long/ring fingers with tendon/artery/nerve involvement  . Ovarian cyst 10/2015    left    Past Surgical History  Procedure Laterality Date  . Cesarean section  2011  . Cesarean section  05/01/2012    Procedure: CESAREAN SECTION;  Surgeon: Lesly DukesKelly H Leggett, MD;  Location: [facility];  Service: Gynecology;  Laterality: N/A;    Family History  Problem Relation Age of Onset  . Hypertension Paternal Grandmother   . Hypertension Paternal Grandfather    Social History:  reports that she has been smoking Cigarettes.  She has smoked for the past 8 years. She has never used smokeless tobacco. She reports that she drinks alcohol. She reports that she does not use illicit drugs.  Allergies: Not on File  No prescriptions prior to admission    No results found for this or any previous visit (from the past 48 hour(s)).  No results found.   A comprehensive review of systems was negative.  Height 5\' 3"  (1.6 m), weight 58.968 kg (130 lb), last menstrual period 10/06/2015.  General appearance: alert, cooperative and appears stated age Head: Normocephalic, without obvious abnormality, atraumatic Neck: supple, symmetrical, trachea midline Resp: clear to auscultation bilaterally Cardio: regular rate and rhythm GI: non tender Extremities: decreased sensation in index and long fingers.  unable to flex index and long fingers.  lacerations to volar index/long/ring fingers.  no erythema. Pulses: 2+ and symmetric Skin: Skin color,  texture, turgor normal. No rashes or lesions Neurologic: Grossly normal Incision/Wound: As above  Assessment/Plan Right index/long/ring finger lacerations with tendon/artery/nerve lacerations.  Recommend OR for exploration with repair of tendon/artery/nerve as necessary.  Risks, benefits, and alternatives of surgery were discussed and the patient agrees with the plan of care.   Marirose Deveney R 10/13/2015, 10:55 AM

## 2015-10-13 NOTE — Anesthesia Preprocedure Evaluation (Signed)
Anesthesia Evaluation  Patient identified by MRN, date of birth, ID band Patient awake    Reviewed: Allergy & Precautions, NPO status , Patient's Chart, lab work & pertinent test results  Airway Mallampati: I  TM Distance: >3 FB Neck ROM: Full    Dental   Pulmonary Current Smoker,    Pulmonary exam normal        Cardiovascular Normal cardiovascular exam     Neuro/Psych    GI/Hepatic   Endo/Other    Renal/GU      Musculoskeletal   Abdominal   Peds  Hematology   Anesthesia Other Findings   Reproductive/Obstetrics                             Anesthesia Physical Anesthesia Plan  ASA: I  Anesthesia Plan: General   Post-op Pain Management:    Induction: Intravenous  Airway Management Planned: LMA  Additional Equipment:   Intra-op Plan:   Post-operative Plan: Extubation in OR  Informed Consent: I have reviewed the patients History and Physical, chart, labs and discussed the procedure including the risks, benefits and alternatives for the proposed anesthesia with the patient or authorized representative who has indicated his/her understanding and acceptance.     Plan Discussed with: CRNA and Surgeon  Anesthesia Plan Comments:         Anesthesia Quick Evaluation

## 2015-10-14 ENCOUNTER — Encounter (HOSPITAL_BASED_OUTPATIENT_CLINIC_OR_DEPARTMENT_OTHER): Payer: Self-pay | Admitting: Orthopedic Surgery

## 2015-10-14 NOTE — Anesthesia Postprocedure Evaluation (Signed)
Anesthesia Post Note  Patient: Administrator, sportsCharly Cabreja  Procedure(s) Performed: Procedure(s) (LRB): RIGHT INDEX LONG RING FINGER REPAIR, TENDON, ARTERY AND NERVE, REPAIR (Right)  Anesthesia type: general  Patient location: PACU  Post pain: Pain level controlled  Post assessment: Patient's Cardiovascular Status Stable  Last Vitals:  Filed Vitals:   10/13/15 1830  BP: 119/78  Pulse: 92  Temp: 36.7 C  Resp: 14    Post vital signs: Reviewed and stable  Level of consciousness: sedated  Complications: No apparent anesthesia complications

## 2015-10-14 NOTE — Op Note (Signed)
[name], [name] NO.:  [id number]  MEDICAL RECORD NO.:  [id number]  LOCATION:                                 FACILITY:  PHYSICIAN:  Krystal Loa, MD        DATE OF BIRTH:  1991-05-09  DATE OF PROCEDURE:  10/13/2015 DATE OF DISCHARGE:                              OPERATIVE REPORT   PREOPERATIVE DIAGNOSIS:  Right index, long and ring finger lacerations with tendon artery nerve laceration.  POSTOPERATIVE DIAGNOSIS:  Right index finger FDP and FDS zone 2 lacerations, radial digital nerve and artery laceration, right long finger FDP and FDS zone 2 laceration, radial digital nerve and artery lacerations, right ring finger laceration.  PROCEDURE:   1. Right index finger repair of FDP zone 2 laceration  2. Right index finger repair of FDS zone 2 laceration 3. Right index finger repair of radial digital nerve laceration under microscope 4. Right index finger repair of radial digital artery laceration under microscope 5. Right index finger repair volar plate at proximal interphalangeal joint 6. Right long finger repair of FDP zone 2 laceration 7. Right long finger repair of FDS zone 2 laceration 8. Right long finger repair of radial digital nerve laceration under microscope 9. Right long finger repair of radial digital artery laceration under microscope 10. Right ring finger exploration of penetrating wound.  SURGEON:  Krystal Loa, MD  ASSISTANT:  Cindee Salt, MD  ANESTHESIA:  General.  IV FLUIDS:  Per Anesthesia flow sheet.  ESTIMATED BLOOD LOSS:  Minimal.  COMPLICATIONS:  None.  SPECIMENS:  None.  TOURNIQUET TIME:  116 minutes.  DISPOSITION:  Stable to PACU.  INDICATIONS:  [name] is a 24 year old right-hand-dominant female who lacerated her right index, long, and ring fingers approximately 11 days ago with the knife.  She was seen in the emergency department where the wounds were irrigated and debrided and sutured.  She followed up in the  office.  She had inability to flex the index and long finger and decreased sensation in the index and long finger.  Recommended operative exploration with repair of tendon, artery, and nerve as necessary. Risks, benefits, and alternatives of surgery were discussed including risk of blood loss; infection; damage to nerves, vessels, tendons, ligaments, bone; failure of surgery; need for additional surgery; complications with wound healing, continued pain, continued numbness and stiffness.  She voiced understanding of these risks and elected to proceed.  OPERATIVE COURSE:  After being identified preoperatively by myself, the patient and I agreed upon procedure and site procedure.  Surgical site was marked.  Risks, benefits, and alternatives of surgery were reviewed and she wished to proceed.  Surgical consent had been signed.  She was given IV Ancef as preoperative antibiotic prophylaxis.  She was transferred to the operating room, placed on the operating room table in supine position with the right upper extremity on arm board.  General anesthesia was induced by Anesthesiology.  Right upper extremity was prepped and draped in normal sterile orthopedic fashion.  Surgical pause was performed between surgeons, anesthesia, operating room staff, and all were in agreement as to the patient, procedure, and site procedure. Tourniquet at the proximal aspect of  the extremity was inflated to 250 mmHg after exsanguination of the limb with Esmarch bandage.  The ring finger was addressed first.  The wound was extended for visualization. The radial digital nerve and artery were visualized and were intact. The ulnar digital nerve and artery were identified and were intact.  The flexor sheath was not violated.  The wound was copiously irrigated with sterile saline.  It was closed with 4-0 nylon in a horizontal mattress fashion.  The wounds on the index and long finger were extended both proximally and  distally in a Brunner-type fashion.  The wounds were explored.  In the index finger, there was laceration of the radial digital nerve and artery and laceration of the FDP and FDS tendons between the A2 and A4 pulleys.  In the long finger, there was laceration of the radial digital nerve and artery laceration of the FDP and FDS tendons in the area between the A2 and A4 pulleys.  Sets of tendon lacerations were close to the A4 pulley.  The ulnar digital nerve and artery in the index and long finger were intact.  The wounds were copiously irrigated with sterile saline.  There was laceration of the volar plate and capsule of the index finger PIP joint.  This was repaired with 5-0 chromic suture in a running fashion.  The tendons of the index finger were addressed first.  The tendon stumps proximally were able to be retrieved.  The A3 pulley had been lacerated in both fingers by the injury.  The 2 arms of the FDS tendon were repaired using a 4-0 Supramid suture in a figure-of-eight fashion.  This provided adequate apposition of the tendon ends.  The FDP tendon was repaired with a modified Kessler looped Supramid 4-0 suture.  This provided good apposition of the tendon ends.  This was augmented with a running epitendinous 6-0 Prolene suture.  In the long finger, the tendon stumps were able to be retrieved.  The 2 arms of the FDS tendon were repaired with the 4-0 Supramid loop suture in a figure-of-eight fashion.  This provided good apposition of the tendon ends.  The FDP tendon was repaired with a modified Kessler core suture with the 4-0 looped Supramid suture.  This again provided good apposition of the tendon. This was augmented with a running epitendinous 6-0 Prolene suture.  The microscope was brought in.  The index finger, the radial digital artery was cleared.  The ends were freshened.  A 9-0 nylon suture was used in an interrupted fashion to reapproximate the arterial ends which was  done successfully.  The ends of the radial digital nerve were freshened. Again the 9-0 nylon suture was used in interrupted circumferential fashion to reapproximate the nerve ends.  This provided good apposition. In the long finger, the arterial ends of the radial digital artery were freshened and the 9-0 nylon suture used in an interrupted circumferential fashion to reapproximate the arterial ends which was done successfully.  The radial digital nerve was able to be reapproximated again using the 9-0 nylon suture in an interrupted circumferential fashion.  The wounds were then closed with 4-0 nylon in a horizontal mattress fashion.  Digital block was performed with 10 mL total of 0.25% plain Marcaine to aid in postoperative analgesia.  The wounds were dressed with sterile Xeroform, 4x4s, and wrapped with Kerlix bandage.  A dorsal blocking splint was placed with the wrist flexed approximately 40 degrees, the MPs flexed, and IPs extended.  This was wrapped with  Kerlix and Ace bandage.  Tourniquet was deflated at 116 minutes.  All fingertips were pink with brisk capillary refill.  After deflation of tourniquet, the operative drapes were broken down.  The patient was awoken from anesthesia safely.  She was transferred back to stretcher and taken to PACU in stable condition.  I will see her back in the office in 1 week for postoperative followup. We will give her Norco 5/325, 1-2 p.o. q.6 hours p.r.n. pain, dispensed #40.     Krystal Loa, MD     KK/MEDQ  D:  10/13/2015  T:  10/14/2015  Job:  [id number]

## 2017-07-03 IMAGING — DX DG HAND COMPLETE 3+V*R*
3 series · 3 of 3 positions shown · non-contrast
Comparison: None.

CLINICAL DATA: Cut hand while attempting to slash tire. Pain first
2 fingers.

EXAM:
RIGHT HAND - COMPLETE 3+ VIEW

[hand pa]
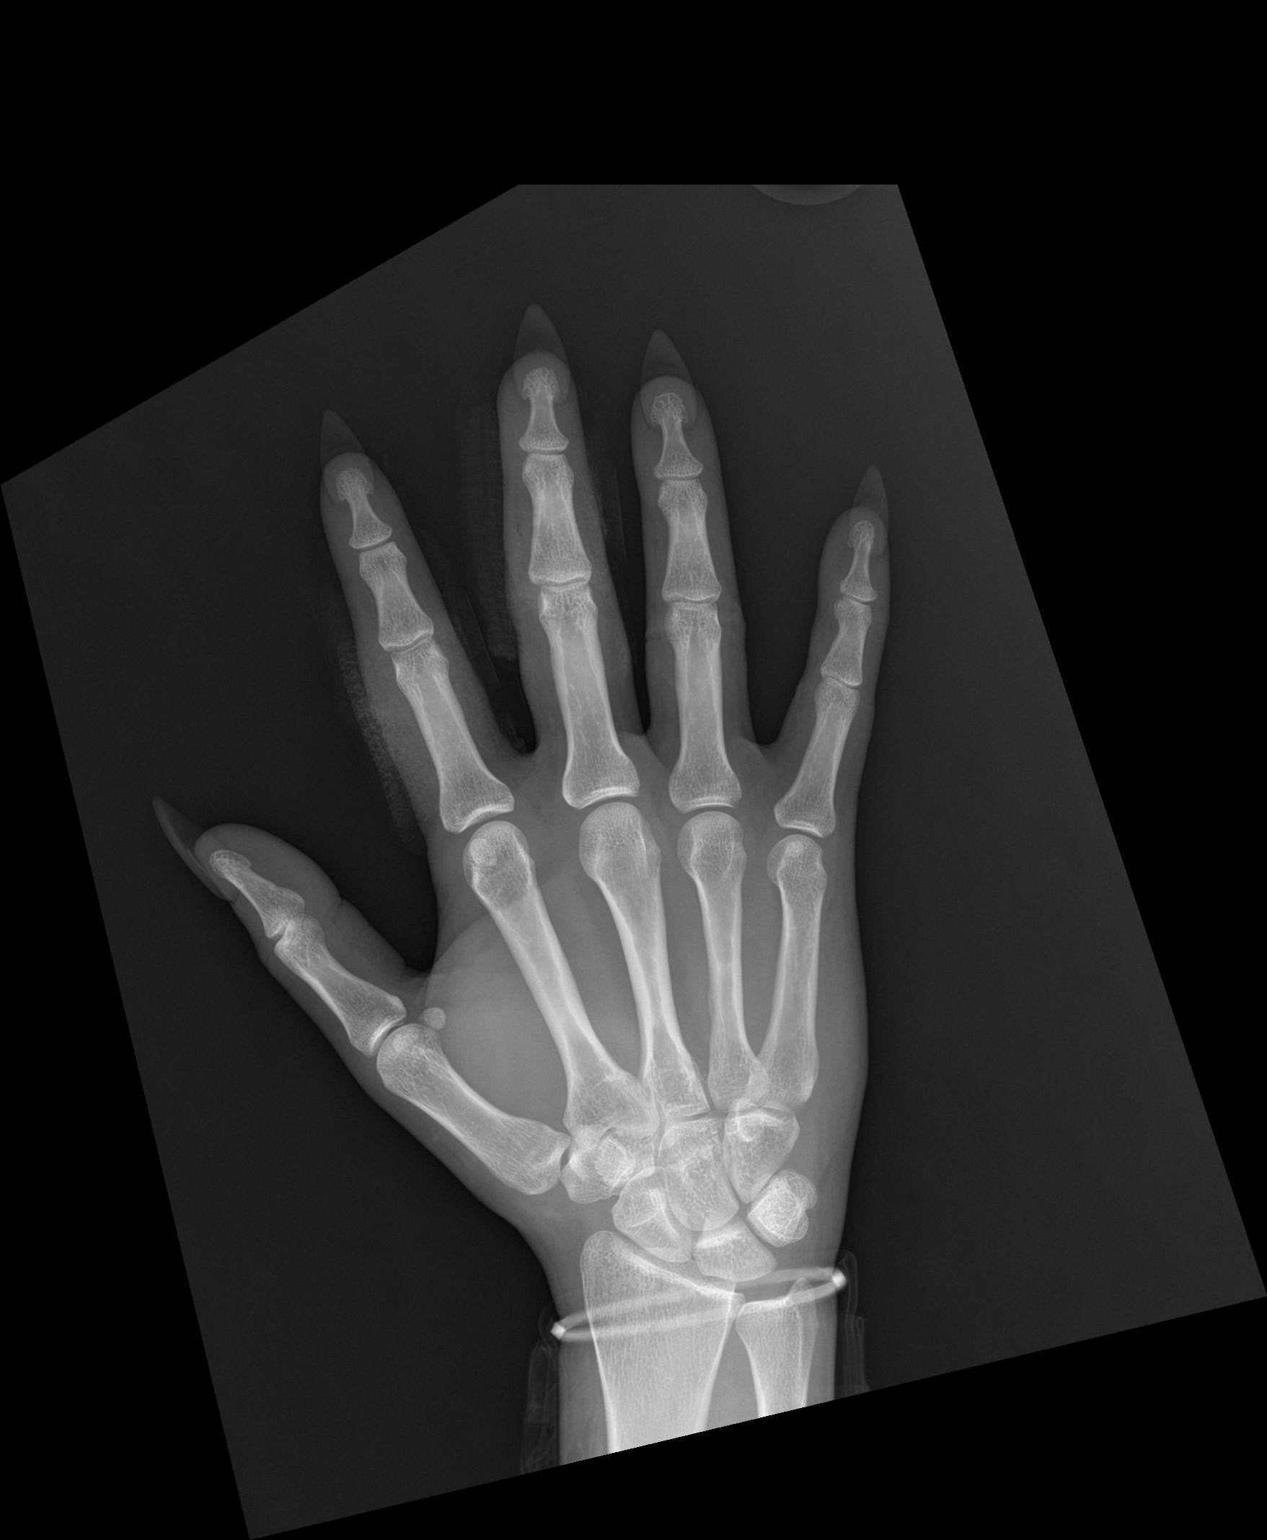

[hand obl]
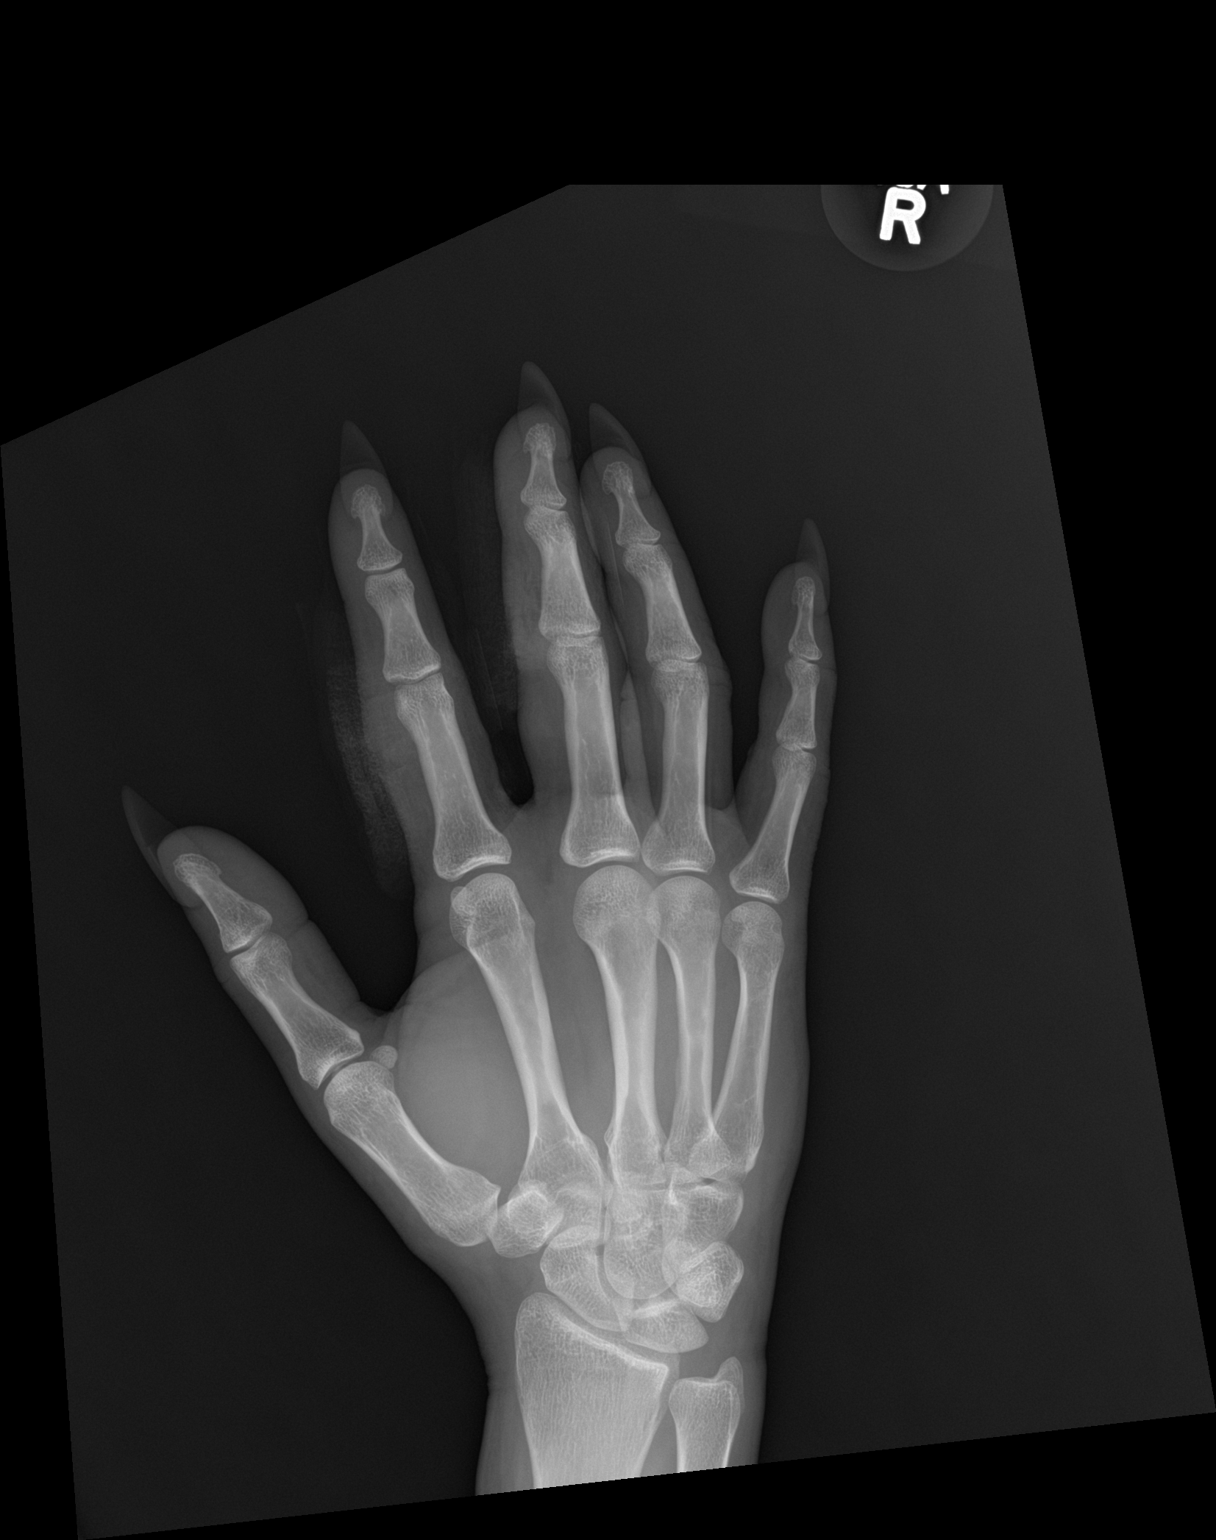

[hand lat]
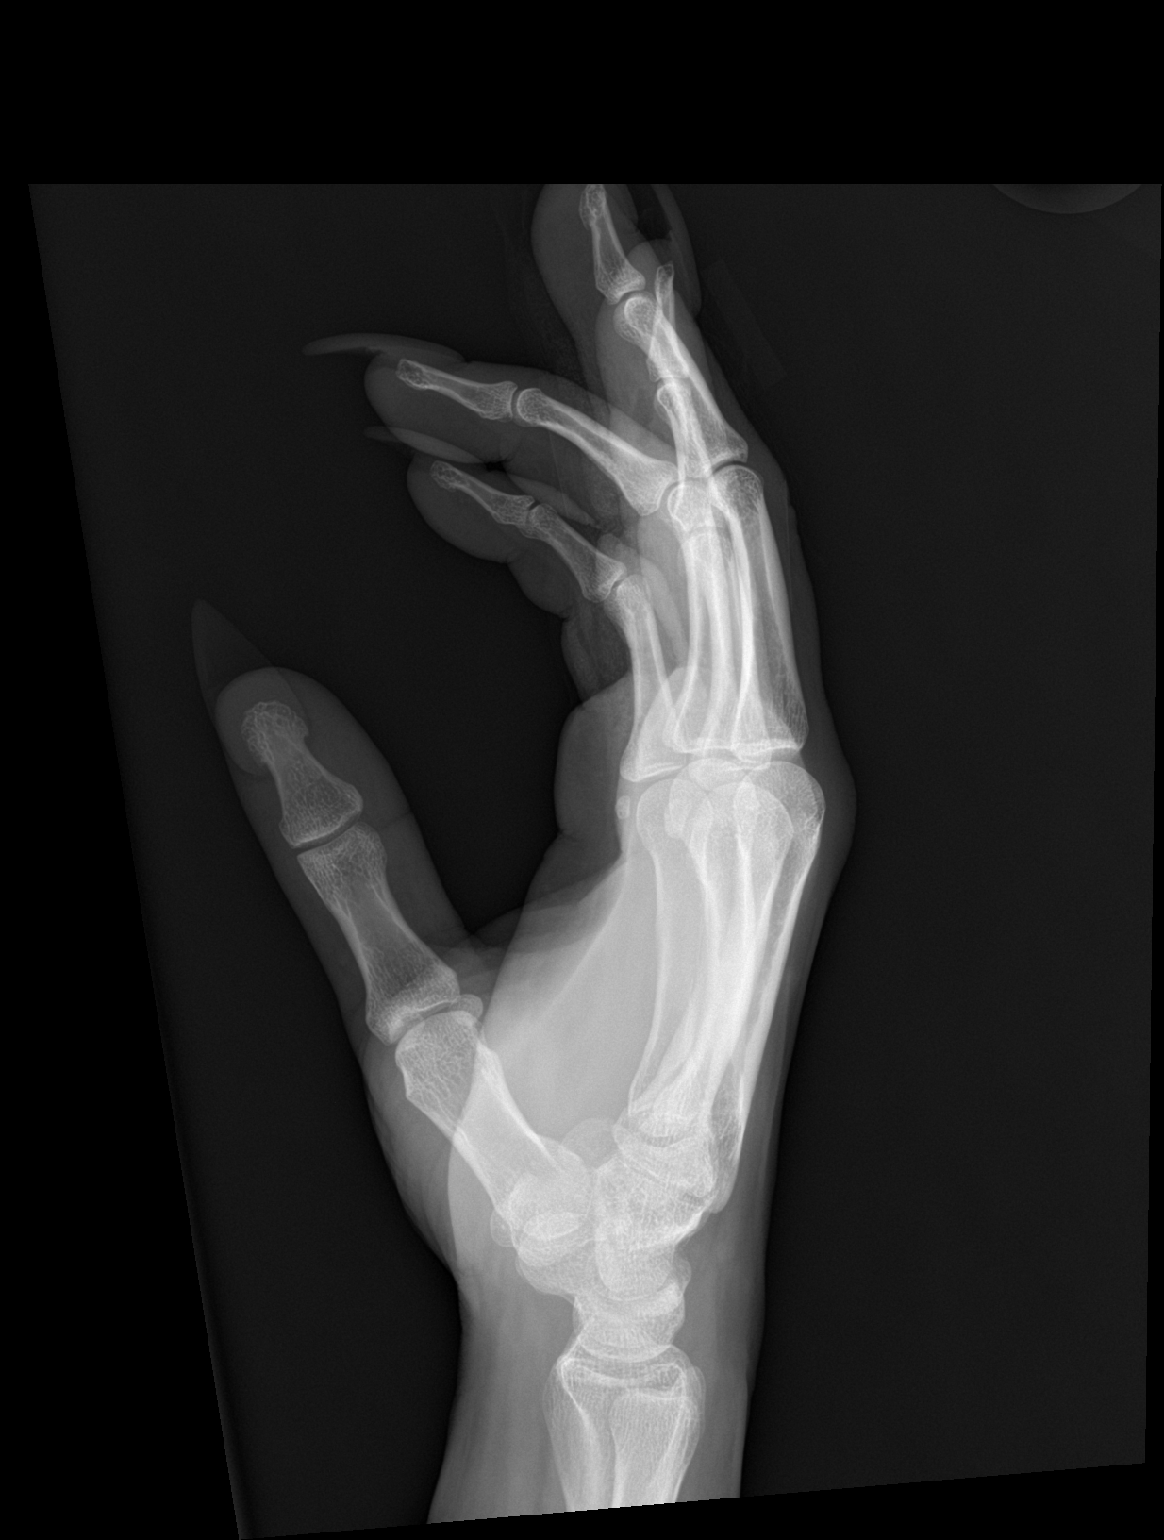

[3 of 3 positions shown; findings below may reference images not displayed]

FINDINGS: There is no evidence of fracture or dislocation. There is no
evidence of arthropathy or other focal bone abnormality. Bandage
overlies the second and third digit, with associated soft tissue
swelling, no definite subcutaneous gas radiopaque foreign bodies.
IMPRESSION: Second and third digit soft tissue swelling with bandage, no acute
fracture deformity or dislocation.
